# Patient Record
Sex: Female | Born: 1985 | Race: Black or African American | Hispanic: No | Marital: Single | Smoking: Current every day smoker
Health system: Southern US, Community
[De-identification: ages and names within clinical notes are randomized; demographics above are authoritative.]

## PROBLEM LIST (undated history)

## (undated) DIAGNOSIS — F319 Bipolar disorder, unspecified: Secondary | ICD-10-CM

## (undated) DIAGNOSIS — R519 Headache, unspecified: Secondary | ICD-10-CM

## (undated) HISTORY — PX: BREAST CYST EXCISION: SHX579

## (undated) HISTORY — PX: TUBAL LIGATION: SHX77

---

## 2008-07-04 ENCOUNTER — Emergency Department (HOSPITAL_COMMUNITY): Admission: EM | Admit: 2008-07-04 | Discharge: 2008-07-04 | Payer: Self-pay | Admitting: Emergency Medicine

## 2008-07-18 ENCOUNTER — Emergency Department (HOSPITAL_COMMUNITY): Admission: EM | Admit: 2008-07-18 | Discharge: 2008-07-18 | Payer: Self-pay | Admitting: Emergency Medicine

## 2008-08-27 ENCOUNTER — Emergency Department (HOSPITAL_COMMUNITY): Admission: EM | Admit: 2008-08-27 | Discharge: 2008-08-27 | Payer: Self-pay | Admitting: Emergency Medicine

## 2008-09-18 ENCOUNTER — Emergency Department (HOSPITAL_COMMUNITY): Admission: EM | Admit: 2008-09-18 | Discharge: 2008-09-18 | Payer: Self-pay | Admitting: Emergency Medicine

## 2010-01-11 ENCOUNTER — Ambulatory Visit: Payer: Self-pay | Admitting: Diagnostic Radiology

## 2010-01-11 ENCOUNTER — Emergency Department (HOSPITAL_BASED_OUTPATIENT_CLINIC_OR_DEPARTMENT_OTHER): Admission: EM | Admit: 2010-01-11 | Discharge: 2010-01-11 | Payer: Self-pay | Admitting: Certified Registered"

## 2010-09-19 LAB — URINALYSIS, ROUTINE W REFLEX MICROSCOPIC
Bilirubin Urine: NEGATIVE
Nitrite: NEGATIVE
Protein, ur: NEGATIVE mg/dL
Specific Gravity, Urine: 1.027 (ref 1.005–1.030)
Urobilinogen, UA: 1 mg/dL (ref 0.0–1.0)

## 2010-09-19 LAB — COMPREHENSIVE METABOLIC PANEL
Albumin: 3.6 g/dL (ref 3.5–5.2)
BUN: 8 mg/dL (ref 6–23)
CO2: 23 mEq/L (ref 19–32)
GFR calc Af Amer: >60 mL/min (ref >60)
Glucose, Bld: 81 mg/dL (ref 70–99)
Potassium: 4.1 mEq/L (ref 3.5–5.1)

## 2010-09-19 LAB — CBC
HCT: 33.9 % — ABNORMAL LOW (ref 36.0–46.0)
Hemoglobin: 11.3 g/dL — ABNORMAL LOW (ref 12.0–15.0)
MCH: 28.4 pg (ref 26.0–34.0)
MCHC: 33.3 g/dL (ref 30.0–36.0)
MCV: 85 fL (ref 78.0–100.0)
Platelets: 276 10*3/uL (ref 150–400)
RBC: 3.98 MIL/uL (ref 3.87–5.11)
RDW: 12.9 % (ref 11.5–15.5)

## 2010-09-19 LAB — WET PREP, GENITAL: Yeast Wet Prep HPF POC: NONE SEEN

## 2010-09-19 LAB — URINE MICROSCOPIC-ADD ON

## 2010-09-19 LAB — GC/CHLAMYDIA PROBE AMP, GENITAL
Chlamydia, DNA Probe: NEGATIVE
GC Probe Amp, Genital: NEGATIVE

## 2010-10-14 LAB — RAPID STREP SCREEN (MED CTR MEBANE ONLY): Streptococcus, Group A Screen (Direct): POSITIVE — AB

## 2010-10-18 LAB — URINALYSIS, ROUTINE W REFLEX MICROSCOPIC
Nitrite: NEGATIVE
Specific Gravity, Urine: 1.035 — ABNORMAL HIGH (ref 1.005–1.030)
Urobilinogen, UA: 1 mg/dL (ref 0.0–1.0)
pH: 7 (ref 5.0–8.0)

## 2010-10-18 LAB — POCT I-STAT, CHEM 8
BUN: 12 mg/dL (ref 6–23)
Calcium, Ion: 1.15 mmol/L (ref 1.12–1.32)
Chloride: 106 mEq/L (ref 96–112)
Creatinine, Ser: 0.9 mg/dL (ref 0.4–1.2)
Glucose, Bld: 92 mg/dL (ref 70–99)
HCT: 44 % (ref 36.0–46.0)
Hemoglobin: 15 g/dL (ref 12.0–15.0)
Potassium: 4.8 mEq/L (ref 3.5–5.1)
Sodium: 141 mEq/L (ref 135–145)
TCO2: 25 mmol/L (ref 0–100)

## 2010-10-18 LAB — URINE MICROSCOPIC-ADD ON

## 2011-04-27 ENCOUNTER — Emergency Department (INDEPENDENT_AMBULATORY_CARE_PROVIDER_SITE_OTHER): Payer: Self-pay

## 2011-04-27 ENCOUNTER — Emergency Department (HOSPITAL_BASED_OUTPATIENT_CLINIC_OR_DEPARTMENT_OTHER)
Admission: EM | Admit: 2011-04-27 | Discharge: 2011-04-27 | Disposition: A | Discharge status: Home / Self Care | Payer: Self-pay | Attending: Emergency Medicine | Admitting: Emergency Medicine

## 2011-04-27 DIAGNOSIS — X58XXXA Exposure to other specified factors, initial encounter: Secondary | ICD-10-CM

## 2011-04-27 DIAGNOSIS — F17200 Nicotine dependence, unspecified, uncomplicated: Secondary | ICD-10-CM | POA: Insufficient documentation

## 2011-04-27 DIAGNOSIS — M25569 Pain in unspecified knee: Secondary | ICD-10-CM

## 2011-04-27 NOTE — ED Notes (Signed)
Right knee pain x 2 weeks

## 2011-04-27 NOTE — ED Provider Notes (Signed)
History     CSN: [id number] Arrival date & time: 04/27/2011  5:55 PM   First MD Initiated Contact with Patient 04/27/11 1756      Chief Complaint  Patient presents with  . Knee Pain    (Consider location/radiation/quality/duration/timing/severity/associated sxs/prior treatment) HPI Comments: Pt states that her knee makes a weird noise when she bend and sometime her knee gives out on her and she falls  Patient is a 25 y.o. female presenting with knee pain. The history is provided by the patient. No language interpreter was used.  Knee Pain This is a new problem. The current episode started 1 to 4 weeks ago. The problem occurs constantly. The problem has been unchanged. Pertinent negatives include no fever or numbness. The symptoms are aggravated by bending. She has tried nothing for the symptoms.    History reviewed. No pertinent past medical history.  Past Surgical History  Procedure Date  . Tubal ligation     No family history on file.  History  Substance Use Topics  . Smoking status: Current Everyday Smoker  . Smokeless tobacco: Not on file  . Alcohol Use: No    OB History    Grav Para Term Preterm Abortions TAB SAB Ect Mult Living                  Review of Systems  Constitutional: Negative for fever.  Respiratory: Negative.   Cardiovascular: Negative.   Musculoskeletal:       Right knee pain  Neurological: Negative for numbness.    Allergies  Review of patient's allergies indicates not on file.  Home Medications  No current outpatient prescriptions on file.  BP 134/72  Pulse 89  Temp(Src) 98.1 F (36.7 C) (Oral)  Resp 16  Ht 5\' 4"  (1.626 m)  Wt 180 lb (81.647 kg)  BMI 30.90 kg/m2  SpO2 100%  LMP 04/02/2011  Physical Exam  Nursing note and vitals reviewed. Constitutional: She is oriented to person, place, and time. She appears well-developed.  HENT:  Head: Normocephalic and atraumatic.  Cardiovascular: Normal rate.   Pulmonary/Chest:  Effort normal and breath sounds normal.  Musculoskeletal: Normal range of motion.       No obvious deformity or swelling noted to the area  Neurological: She is alert and oriented to person, place, and time.    ED Course  Procedures (including critical care time)  Labs Reviewed - No data to display Dg Knee Complete 4 Views Right  04/27/2011  *RADIOLOGY REPORT*  Clinical Data: Remote right knee injury with intermittent episodes of the knee buckling.  Anterior knee pain currently.  No recent injuries.  RIGHT KNEE - COMPLETE 4+ VIEW 04/27/2011:  Comparison: None.  Findings: No evidence of acute, subacute, or healed fractures. Well-preserved joint spaces.  No intrinsic osseous abnormalities. No evidence of a significant joint effusion.  IMPRESSION: Normal examination.  Original Report Authenticated By: Arnell Sieving, M.D.     1. Knee pain       MDM  No acute finding on exam or on x-ray:pt is okay to follow up as needed        Teressa Lower, NP 04/27/11 1846

## 2011-04-28 NOTE — ED Provider Notes (Signed)
Medical screening examination/treatment/procedure(s) were performed by non-physician practitioner and as supervising physician I was immediately available for consultation/collaboration.   Bevan Disney A. Krystl Wickware, MD 04/28/11 0003 

## 2013-09-09 ENCOUNTER — Emergency Department (HOSPITAL_BASED_OUTPATIENT_CLINIC_OR_DEPARTMENT_OTHER)
Admission: EM | Admit: 2013-09-09 | Discharge: 2013-09-09 | Disposition: A | Discharge status: Home / Self Care | Payer: No Typology Code available for payment source | Attending: Emergency Medicine | Admitting: Emergency Medicine

## 2013-09-09 ENCOUNTER — Encounter (HOSPITAL_BASED_OUTPATIENT_CLINIC_OR_DEPARTMENT_OTHER): Payer: Self-pay | Admitting: Emergency Medicine

## 2013-09-09 ENCOUNTER — Emergency Department (HOSPITAL_BASED_OUTPATIENT_CLINIC_OR_DEPARTMENT_OTHER): Payer: No Typology Code available for payment source

## 2013-09-09 DIAGNOSIS — K5289 Other specified noninfective gastroenteritis and colitis: Secondary | ICD-10-CM | POA: Insufficient documentation

## 2013-09-09 DIAGNOSIS — Z79899 Other long term (current) drug therapy: Secondary | ICD-10-CM | POA: Insufficient documentation

## 2013-09-09 DIAGNOSIS — Z880 Allergy status to penicillin: Secondary | ICD-10-CM | POA: Insufficient documentation

## 2013-09-09 DIAGNOSIS — F17200 Nicotine dependence, unspecified, uncomplicated: Secondary | ICD-10-CM | POA: Insufficient documentation

## 2013-09-09 DIAGNOSIS — Z3202 Encounter for pregnancy test, result negative: Secondary | ICD-10-CM | POA: Insufficient documentation

## 2013-09-09 DIAGNOSIS — K625 Hemorrhage of anus and rectum: Secondary | ICD-10-CM | POA: Insufficient documentation

## 2013-09-09 DIAGNOSIS — K529 Noninfective gastroenteritis and colitis, unspecified: Secondary | ICD-10-CM

## 2013-09-09 DIAGNOSIS — F319 Bipolar disorder, unspecified: Secondary | ICD-10-CM | POA: Insufficient documentation

## 2013-09-09 DIAGNOSIS — R634 Abnormal weight loss: Secondary | ICD-10-CM | POA: Insufficient documentation

## 2013-09-09 HISTORY — DX: Bipolar disorder, unspecified: F31.9

## 2013-09-09 LAB — CBC WITH DIFFERENTIAL/PLATELET
Basophils Absolute: 0 10*3/uL (ref 0.0–0.1)
Basophils Relative: 1 % (ref 0–1)
EOS PCT: 5 % (ref 0–5)
Eosinophils Absolute: 0.3 10*3/uL (ref 0.0–0.7)
HCT: 31.7 % — ABNORMAL LOW (ref 36.0–46.0)
HEMOGLOBIN: 10.4 g/dL — AB (ref 12.0–15.0)
LYMPHS PCT: 52 % — AB (ref 12–46)
Lymphs Abs: 3.3 10*3/uL (ref 0.7–4.0)
MCH: 27.7 pg (ref 26.0–34.0)
MCHC: 32.8 g/dL (ref 30.0–36.0)
MCV: 84.5 fL (ref 78.0–100.0)
MONO ABS: 0.7 10*3/uL (ref 0.1–1.0)
MONOS PCT: 10 % (ref 3–12)
NEUTROS ABS: 2 10*3/uL (ref 1.7–7.7)
Neutrophils Relative %: 32 % — ABNORMAL LOW (ref 43–77)
PLATELETS: 275 10*3/uL (ref 150–400)
RBC: 3.75 MIL/uL — AB (ref 3.87–5.11)
RDW: 13.8 % (ref 11.5–15.5)
WBC: 6.4 10*3/uL (ref 4.0–10.5)

## 2013-09-09 LAB — COMPREHENSIVE METABOLIC PANEL
ALT: 8 U/L (ref 0–35)
AST: 14 U/L (ref 0–37)
Albumin: 3.2 g/dL — ABNORMAL LOW (ref 3.5–5.2)
Alkaline Phosphatase: 51 U/L (ref 39–117)
BUN: 11 mg/dL (ref 6–23)
CALCIUM: 8.2 mg/dL — AB (ref 8.4–10.5)
CO2: 21 mEq/L (ref 19–32)
Chloride: 108 mEq/L (ref 96–112)
Creatinine, Ser: 0.7 mg/dL (ref 0.50–1.10)
GFR calc non Af Amer: >90 mL/min (ref >90)
Glucose, Bld: 97 mg/dL (ref 70–99)
Potassium: 3.9 mEq/L (ref 3.7–5.3)
SODIUM: 141 meq/L (ref 137–147)
Total Protein: 6.2 g/dL (ref 6.0–8.3)

## 2013-09-09 LAB — URINALYSIS, ROUTINE W REFLEX MICROSCOPIC
BILIRUBIN URINE: NEGATIVE
GLUCOSE, UA: NEGATIVE mg/dL
HGB URINE DIPSTICK: NEGATIVE
Ketones, ur: NEGATIVE mg/dL
Leukocytes, UA: NEGATIVE
NITRITE: NEGATIVE
PH: 6 (ref 5.0–8.0)
PROTEIN: NEGATIVE mg/dL
Specific Gravity, Urine: 1.029 (ref 1.005–1.030)
UROBILINOGEN UA: 1 mg/dL (ref 0.0–1.0)

## 2013-09-09 LAB — OCCULT BLOOD X 1 CARD TO LAB, STOOL: Fecal Occult Bld: POSITIVE — AB

## 2013-09-09 LAB — PREGNANCY, URINE: PREG TEST UR: NEGATIVE

## 2013-09-09 MED ORDER — IOHEXOL 300 MG/ML  SOLN
100.0000 mL | Freq: Once | INTRAMUSCULAR | Status: AC | PRN
  Administered 2013-09-09: 100 mL via INTRAVENOUS

## 2013-09-09 MED ORDER — CIPROFLOXACIN HCL 500 MG PO TABS: 500.0000 mg | ORAL_TABLET | Freq: Two times a day (BID) | ORAL | Status: DC

## 2013-09-09 MED ORDER — IOHEXOL 300 MG/ML  SOLN
50.0000 mL | Freq: Once | INTRAMUSCULAR | Status: AC | PRN
  Administered 2013-09-09: 50 mL via ORAL

## 2013-09-09 MED ORDER — METRONIDAZOLE 500 MG PO TABS: 500.0000 mg | ORAL_TABLET | Freq: Two times a day (BID) | ORAL | Status: DC

## 2013-09-09 MED ORDER — SODIUM CHLORIDE 0.9 % IV BOLUS (SEPSIS)
1000.0000 mL | Freq: Once | INTRAVENOUS | Status: AC
  Administered 2013-09-09: 1000 mL via INTRAVENOUS

## 2013-09-09 NOTE — Discharge Instructions (Signed)
Abdominal Pain, Adult °Many things can cause abdominal pain. Usually, abdominal pain is not caused by a disease and will improve without treatment. It can often be observed and treated at home. Your health care provider will do a physical exam and possibly order blood tests and X-rays to help determine the seriousness of your pain. However, in many cases, more time must pass before a clear cause of the pain can be found. Before that point, your health care provider may not know if you need more testing or further treatment. °HOME CARE INSTRUCTIONS  °Monitor your abdominal pain for any changes. The following actions may help to alleviate any discomfort you are experiencing: °· Only take over-the-counter or prescription medicines as directed by your health care provider. °· Do not take laxatives unless directed to do so by your health care provider. °· Try a clear liquid diet (broth, tea, or water) as directed by your health care provider. Slowly move to a bland diet as tolerated. °SEEK MEDICAL CARE IF: °· You have unexplained abdominal pain. °· You have abdominal pain associated with nausea or diarrhea. °· You have pain when you urinate or have a bowel movement. °· You experience abdominal pain that wakes you in the night. °· You have abdominal pain that is worsened or improved by eating food. °· You have abdominal pain that is worsened with eating fatty foods. °SEEK IMMEDIATE MEDICAL CARE IF:  °· Your pain does not go away within 2 hours. °· You have a fever. °· You keep throwing up (vomiting). °· Your pain is felt only in portions of the abdomen, such as the right side or the left lower portion of the abdomen. °· You pass bloody or black tarry stools. °MAKE SURE YOU: °· Understand these instructions.   °· Will watch your condition.   °· Will get help right away if you are not doing well or get worse.   °Document Released: 03/30/2005 Document Revised: 04/10/2013 Document Reviewed: 02/27/2013 °[employer]® Patient  Information ©2014 [employer]. ° °Bloody Stools °Bloody stools often mean that there is a problem in the digestive tract. Your caregiver may use the term "melena" to describe black, tarry, and bad smelling stools or "hematochezia" to describe red or maroon-colored stools. Blood seen in the stool can be caused by bleeding anywhere along the intestinal tract.  °A black stool usually means that blood is coming from the upper part of the gastrointestinal tract (esophagus, stomach, or small bowel). Passing maroon-colored stools or bright red blood usually means that blood is coming from lower down in the large bowel or the rectum. However, sometimes massive bleeding in the stomach or small intestine can cause bright red bloody stools.  °Consuming black licorice, lead, iron pills, medicines containing bismuth subsalicylate, or blueberries can also cause black stools. Your caregiver can test black stools to see if blood is present. °It is important that the cause of the bleeding be found. Treatment can then be started, and the problem can be corrected. Rectal bleeding may not be serious, but you should not assume everything is okay until you know the cause. It is very important to follow up with your caregiver or a specialist in gastrointestinal problems. °CAUSES  °Blood in the stools can come from various underlying causes. Often, the cause is not found during your first visit. Testing is often needed to discover the cause of bleeding in the gastrointestinal tract. Causes range from simple to serious or even life-threatening. Possible causes include: °· Hemorrhoids. These are veins that are full of blood (engorged)   in the rectum. They cause pain, inflammation, and may bleed. °· Anal fissures. These are areas of painful tearing which may bleed. They are often caused by passing hard stool. °· Diverticulosis. These are pouches that form on the colon over time, with age, and may bleed significantly. °· Diverticulitis. This  is inflammation in areas with diverticulosis. It can cause pain, fever, and bloody stools, although bleeding is rare. °· Proctitis and colitis. These are inflamed areas of the rectum or colon. They may cause pain, fever, and bloody stools. °· Polyps and cancer. Colon cancer is a leading cause of preventable cancer death. It often starts out as precancerous polyps that can be removed during a colonoscopy, preventing progression into cancer. Sometimes, polyps and cancer may cause rectal bleeding. °· Gastritis and ulcers. Bleeding from the upper gastrointestinal tract (near the stomach) may travel through the intestines and produce black, sometimes tarry, often bad smelling stools. In certain cases, if the bleeding is fast enough, the stools may not be black, but red and the condition may be life-threatening. °SYMPTOMS  °You may have stools that are bright red and bloody, that are normal color with blood on them, or that are dark black and tarry. In some cases, you may only have blood in the toilet bowl. Any of these cases need medical care. You may also have: °· Pain at the anus or anywhere in the rectum. °· Lightheadedness or feeling faint. °· Extreme weakness. °· Nausea or vomiting. °· Fever. °DIAGNOSIS °Your caregiver may use the following methods to find the cause of your bleeding: °· Taking a medical history. Age is important. Older people tend to develop polyps and cancer more often. If there is anal pain and a hard, large stool associated with bleeding, a tear of the anus may be the cause. If blood drips into the toilet after a bowel movement, bleeding hemorrhoids may be the problem. The color and frequency of the bleeding are additional considerations. In most cases, the medical history provides clues, but seldom the final answer. °· A visual and finger (digital) exam. Your caregiver will inspect the anal area, looking for tears and hemorrhoids. A finger exam can provide information when there is tenderness or  a growth inside. In men, the prostate is also examined. °· Endoscopy. Several types of small, long scopes (endoscopes) are used to view the colon. °· In the office, your caregiver may use a rigid, or more commonly, a flexible viewing sigmoidoscope. This exam is called flexible sigmoidoscopy. It is performed in 5 to 10 minutes. °· A more thorough exam is accomplished with a colonoscope. It allows your caregiver to view the entire 5 to 6 foot long colon. Medicine to help you relax (sedative) is usually given for this exam. Frequently, a bleeding lesion may be present beyond the reach of the sigmoidoscope. So, a colonoscopy may be the best exam to start with. Both exams are usually done on an outpatient basis. This means the patient does not stay overnight in the hospital or surgery center. °· An upper endoscopy may be needed to examine your stomach. Sedation is used and a flexible endoscope is put in your mouth, down to your stomach. °· A barium enema X-ray. This is an X-ray exam. It uses liquid barium inserted by enema into the rectum. This test alone may not identify an actual bleeding point. X-rays highlight abnormal shadows, such as those made by lumps (tumors), diverticuli, or colitis. °TREATMENT  °Treatment depends on the cause of your bleeding.  °· For bleeding from   the stomach or colon, the caregiver doing your endoscopy or colonoscopy may be able to stop the bleeding as part of the procedure. °· Inflammation or infection of the colon can be treated with medicines. °· Many rectal problems can be treated with creams, suppositories, or warm baths. °· Surgery is sometimes needed. °· Blood transfusions are sometimes needed if you have lost a lot of blood. °· For any bleeding problem, let your caregiver know if you take aspirin or other blood thinners regularly. °HOME CARE INSTRUCTIONS  °· Take any medicines exactly as prescribed. °· Keep your stools soft by eating a diet high in fiber. Prunes (1 to 3 a day) work  well for many people. °· Drink enough water and fluids to keep your urine clear or pale yellow. °· Take sitz baths if advised. A sitz bath is when you sit in a bathtub with warm water for 10 to 15 minutes to soak, soothe, and cleanse the rectal area. °· If enemas or suppositories are advised, be sure you know how to use them. Tell your caregiver if you have problems with this. °· Monitor your bowel movements to look for signs of improvement or worsening. °SEEK MEDICAL CARE IF:  °· You do not improve in the time expected. °· Your condition worsens after initial improvement. °· You develop any new symptoms. °SEEK IMMEDIATE MEDICAL CARE IF:  °· You develop severe or prolonged rectal bleeding. °· You vomit blood. °· You feel weak or faint. °· You have a fever. °MAKE SURE YOU: °· Understand these instructions. °· Will watch your condition. °· Will get help right away if you are not doing well or get worse. °Document Released: 06/10/2002 Document Revised: 09/12/2011 Document Reviewed: 11/05/2010 °[employer]® Patient Information ©2014 [employer]. ° °

## 2013-09-09 NOTE — ED Notes (Signed)
Rectal bleeding onset last night

## 2013-09-09 NOTE — ED Provider Notes (Signed)
CSN: [id number]     Arrival date & time 09/09/13  1040 History   First MD Initiated Contact with Patient 09/09/13 1136     Chief Complaint  Patient presents with  . Abdominal Pain  . Rectal Bleeding     (Consider location/radiation/quality/duration/timing/severity/associated sxs/prior Treatment) HPI Comments: 28 year old female presents with bleeding per rectum since yesterday. She noticed dark red blood when she went to stool and has had a couple episodes since. She states she's been having some lower abdominal pain that she describes as mild. She feels like the pain is in her left lower abdomen and seems to radiate to her rectum. Denies any fevers or chills. Has a history of hemorrhoids but states this does not feel similar. She states that she's not any nausea or vomiting. She feels that she's lost 7 pounds over the last 2 weeks without trying. No prior history of Crohn's or ulcerative colitis. No family history of inflammatory bowel disease.   Past Medical History  Diagnosis Date  . Bipolar 1 disorder    Past Surgical History  Procedure Laterality Date  . Tubal ligation     History reviewed. No pertinent family history. History  Substance Use Topics  . Smoking status: Current Every Day Smoker  . Smokeless tobacco: Not on file  . Alcohol Use: Yes     Comment: occ   OB History   Grav Para Term Preterm Abortions TAB SAB Ect Mult Living                 Review of Systems  Constitutional: Positive for unexpected weight change. Negative for fever and chills.  Gastrointestinal: Positive for abdominal pain and blood in stool. Negative for nausea, vomiting, diarrhea and constipation.  Genitourinary: Negative for dysuria.  Musculoskeletal: Negative for back pain.  All other systems reviewed and are negative.      Allergies  Keflex and Penicillins cross reactors  Home Medications   Current Outpatient Rx  Name  Route  Sig  Dispense  Refill  . ARIPiprazole (ABILIFY) 5 MG  tablet   Oral   Take 5 mg by mouth daily.           Marland Kitchen. buPROPion (WELLBUTRIN XL) 150 MG 24 hr tablet   Oral   Take 150 mg by mouth daily.            BP 112/69  Pulse 84  Temp(Src) 98.6 F (37 C) (Oral)  Resp 16  Ht 5\' 4"  (1.626 m)  Wt 180 lb (81.647 kg)  BMI 30.88 kg/m2  SpO2 100%  LMP 09/05/2013 Physical Exam  Nursing note and vitals reviewed. Constitutional: She is oriented to person, place, and time. She appears well-developed and well-nourished. No distress.  HENT:  Head: Normocephalic and atraumatic.  Right Ear: External ear normal.  Left Ear: External ear normal.  Nose: Nose normal.  Eyes: Right eye exhibits no discharge. Left eye exhibits no discharge.  Cardiovascular: Normal rate, regular rhythm and normal heart sounds.   Pulmonary/Chest: Effort normal and breath sounds normal.  Abdominal: Soft. There is tenderness in the right lower quadrant, suprapubic area and left lower quadrant.  Genitourinary: Rectal exam shows tenderness (mild). Rectal exam shows no external hemorrhoid, no internal hemorrhoid, no mass and anal tone normal. Guaiac positive stool.  Neurological: She is alert and oriented to person, place, and time.  Skin: Skin is warm and dry.    ED Course  Procedures (including critical care time) Labs Review Labs Reviewed  CBC WITH  DIFFERENTIAL - Abnormal; Notable for the following:    RBC 3.75 (*)    Hemoglobin 10.4 (*)    HCT 31.7 (*)    Neutrophils Relative % 32 (*)    Lymphocytes Relative 52 (*)    All other components within normal limits  COMPREHENSIVE METABOLIC PANEL - Abnormal; Notable for the following:    Calcium 8.2 (*)    Albumin 3.2 (*)    Total Bilirubin <0.2 (*)    All other components within normal limits  OCCULT BLOOD X 1 CARD TO LAB, STOOL - Abnormal; Notable for the following:    Fecal Occult Bld POSITIVE (*)    All other components within normal limits  URINALYSIS, ROUTINE W REFLEX MICROSCOPIC  PREGNANCY, URINE   Imaging  Review Ct Abdomen Pelvis W Contrast  09/09/2013   CLINICAL DATA:  Lower abdominal pain and cramping. Recent blood in stools.  EXAM: CT ABDOMEN AND PELVIS WITH CONTRAST  TECHNIQUE: Multidetector CT imaging of the abdomen and pelvis was performed using the standard protocol following bolus administration of intravenous contrast.  CONTRAST:  50mL OMNIPAQUE IOHEXOL 300 MG/ML SOLN, OMNIPAQUE IOHEXOL 300 MG/ML SOLN  COMPARISON:  CT of the abdomen and pelvis 10/21/2009.  FINDINGS: Lung Bases: Unremarkable.  Abdomen/Pelvis: The appearance of the liver, gallbladder, pancreas, spleen, bilateral adrenal glands and bilateral kidneys is unremarkable. No significant volume of ascites. No pneumoperitoneum. No pathologic distention of small bowel. Normal appendix. There are some areas of potential colonic wall thickening, particularly in the ascending colon and hepatic flexure and, however, these areas are relatively under distended, which can result in artifactual colonic wall thickening. No surrounding inflammatory changes are noted at this time. No definite lymphadenopathy identified within the abdomen or pelvis. Uterus and ovaries are unremarkable in appearance. Bilateral tubal ligation clips are noted. Urinary bladder is unremarkable in appearance.  Musculoskeletal: There are no aggressive appearing lytic or blastic lesions noted in the visualized portions of the skeleton.  IMPRESSION: 1. There are potential areas of colonic wall thickening involving the ascending colon and hepatic flexure. However, there are no surrounding inflammatory changes at this time, and these areas of the colon are relatively decompressed which can result in artifactual wall thickening. Clinical correlation is recommended for signs and symptoms of potential colitis. 2. No other potential acute findings are identified in the abdomen or pelvis at this time. 3. Specifically, the appendix is normal. 4. Additional incidental findings, as above.    Electronically Signed   By: Trudie Reed M.D.   On: 09/09/2013 13:36     EKG Interpretation None      MDM   Final diagnoses:  Bright red blood per rectum  Colitis    The patient is well-appearing and has only minimal abdominal tenderness. Given the new abdominal pain and bright red blood per abdomen CT was obtained that shows possible colitis although there's no obvious inflammation. The patient has remained stable and well-appearing here. I feel he has mild pain and appears comfortable I highly doubt ischemic colitis. As the case with Dr. Dulce Sellar, gastroenterology on call, who recommends antibiotics (cipro/flagyl) with probiotics as well as followup in his clinic in one to 2 weeks. He feels that given her age group and the CT scan results is most likely infectious. She may or may not need it colonoscopy depending on how her course goes. At this point her anemia is mild and feels she is stable for outpatient treatment. She's given strict return precautions.    Giani Betzold  Scarlette Calico, MD 09/09/13 1501

## 2013-10-13 ENCOUNTER — Emergency Department (HOSPITAL_BASED_OUTPATIENT_CLINIC_OR_DEPARTMENT_OTHER): Payer: No Typology Code available for payment source

## 2013-10-13 ENCOUNTER — Encounter (HOSPITAL_BASED_OUTPATIENT_CLINIC_OR_DEPARTMENT_OTHER): Payer: Self-pay | Admitting: Emergency Medicine

## 2013-10-13 ENCOUNTER — Emergency Department (HOSPITAL_BASED_OUTPATIENT_CLINIC_OR_DEPARTMENT_OTHER)
Admission: EM | Admit: 2013-10-13 | Discharge: 2013-10-13 | Disposition: A | Discharge status: Home / Self Care | Payer: No Typology Code available for payment source | Attending: Emergency Medicine | Admitting: Emergency Medicine

## 2013-10-13 DIAGNOSIS — Z880 Allergy status to penicillin: Secondary | ICD-10-CM | POA: Insufficient documentation

## 2013-10-13 DIAGNOSIS — Y92838 Other recreation area as the place of occurrence of the external cause: Secondary | ICD-10-CM

## 2013-10-13 DIAGNOSIS — F17200 Nicotine dependence, unspecified, uncomplicated: Secondary | ICD-10-CM | POA: Insufficient documentation

## 2013-10-13 DIAGNOSIS — S5290XA Unspecified fracture of unspecified forearm, initial encounter for closed fracture: Secondary | ICD-10-CM | POA: Insufficient documentation

## 2013-10-13 DIAGNOSIS — R296 Repeated falls: Secondary | ICD-10-CM | POA: Insufficient documentation

## 2013-10-13 DIAGNOSIS — Y9341 Activity, dancing: Secondary | ICD-10-CM | POA: Insufficient documentation

## 2013-10-13 DIAGNOSIS — Y9239 Other specified sports and athletic area as the place of occurrence of the external cause: Secondary | ICD-10-CM | POA: Insufficient documentation

## 2013-10-13 DIAGNOSIS — Z8659 Personal history of other mental and behavioral disorders: Secondary | ICD-10-CM | POA: Insufficient documentation

## 2013-10-13 DIAGNOSIS — S52122A Displaced fracture of head of left radius, initial encounter for closed fracture: Secondary | ICD-10-CM

## 2013-10-13 MED ORDER — OXYCODONE-ACETAMINOPHEN 5-325 MG PO TABS: 2.0000 | ORAL_TABLET | Freq: Four times a day (QID) | ORAL | Status: DC | PRN

## 2013-10-13 MED ORDER — HYDROMORPHONE HCL PF 1 MG/ML IJ SOLN
1.0000 mg | Freq: Once | INTRAMUSCULAR | Status: AC
  Administered 2013-10-13: 1 mg via INTRAMUSCULAR
  Filled 2013-10-13: qty 1

## 2013-10-13 MED ORDER — ONDANSETRON 4 MG PO TBDP
4.0000 mg | ORAL_TABLET | Freq: Once | ORAL | Status: AC
  Administered 2013-10-13: 4 mg via ORAL

## 2013-10-13 MED ORDER — ONDANSETRON 4 MG PO TBDP
ORAL_TABLET | ORAL | Status: AC
  Administered 2013-10-13: 4 mg via ORAL
  Filled 2013-10-13: qty 1

## 2013-10-13 MED ORDER — LORAZEPAM 1 MG PO TABS
1.0000 mg | ORAL_TABLET | Freq: Once | ORAL | Status: AC
  Administered 2013-10-13: 1 mg via ORAL
  Filled 2013-10-13: qty 1

## 2013-10-13 NOTE — ED Notes (Signed)
Pt fell on left arm on Saturday morning.  Pt having severe pain to left elbow, pain radiates up arm and down to fingers.  Able to move fingers and left shoulder but the pain is severe at elbow with movement.

## 2013-10-13 NOTE — ED Provider Notes (Signed)
CSN: [id number]     Arrival date & time 10/13/13  4098 History   First MD Initiated Contact with Patient 10/13/13 0945     Chief Complaint  Patient presents with  . Arm Injury     (Consider location/radiation/quality/duration/timing/severity/associated sxs/prior Treatment) Patient is a 28 y.o. female presenting with arm injury. The history is provided by the patient.  Arm Injury Location:  Elbow Time since incident:  36 hours Injury: yes   Mechanism of injury: fall   Fall:    Fall occurred: on the dance floor.   Impact surface:  Hard floor   Point of impact:  Outstretched arms   Entrapped after fall: no   Elbow location:  L elbow Pain details:    Quality:  Dull   Radiates to:  Does not radiate   Severity:  Severe   Onset quality:  Gradual   Duration:  36 hours   Timing:  Constant   Progression:  Worsening Relieved by:  Nothing Worsened by:  Nothing tried Ineffective treatments:  None tried Associated symptoms: no back pain, no fever and no neck pain     Past Medical History  Diagnosis Date  . Bipolar 1 disorder    Past Surgical History  Procedure Laterality Date  . Tubal ligation     No family history on file. History  Substance Use Topics  . Smoking status: Current Every Day Smoker  . Smokeless tobacco: Not on file  . Alcohol Use: Yes     Comment: occ   OB History   Grav Para Term Preterm Abortions TAB SAB Ect Mult Living                 Review of Systems  Constitutional: Negative for fever.  Musculoskeletal: Negative for back pain and neck pain.  All other systems reviewed and are negative.     Allergies  Keflex and Penicillins cross reactors  Home Medications  No current outpatient prescriptions on file. BP 140/75  Pulse 92  Temp(Src) 99 F (37.2 C) (Oral)  Resp 16  SpO2 98%  LMP 10/07/2013 Physical Exam  Nursing note and vitals reviewed. Constitutional: She is oriented to person, place, and time. She appears well-developed and  well-nourished. No distress.  HENT:  Head: Normocephalic and atraumatic.  Eyes: EOM are normal. Pupils are equal, round, and reactive to light.  Neck: Normal range of motion. Neck supple.  Cardiovascular: Normal rate and regular rhythm.  Exam reveals no friction rub.   No murmur heard. Pulmonary/Chest: Effort normal and breath sounds normal. No respiratory distress. She has no wheezes. She has no rales.  Abdominal: Soft. She exhibits no distension. There is no tenderness. There is no rebound.  Musculoskeletal: She exhibits no edema.       Left shoulder: She exhibits decreased range of motion and spasm (anteriorly). She exhibits no bony tenderness, no swelling, no effusion, no crepitus and no deformity.       Left elbow: She exhibits decreased range of motion and swelling (mild). She exhibits no effusion, no deformity and no laceration. Tenderness (diffuse) found. Radial head, medial epicondyle, lateral epicondyle and olecranon process tenderness noted.       Left wrist: Normal.       Left upper arm: She exhibits bony tenderness (distal humerus) and swelling (mild). She exhibits no deformity and no laceration.       Left hand: Normal.  Neurological: She is alert and oriented to person, place, and time. No cranial nerve deficit. She  exhibits normal muscle tone.  Skin: No rash noted. She is not diaphoretic.    ED Course  Procedures (including critical care time) Labs Review Labs Reviewed - No data to display Imaging Review Dg Elbow Complete Left  10/13/2013   CLINICAL DATA:  ARM INJURY  EXAM: LEFT ELBOW - COMPLETE 3+ VIEW ; the patient had difficulty with assuming the oblique views  COMPARISON:  None.  FINDINGS: The bones appear adequately mineralized. The radial head exhibits a subtle fracture involving the articular surface. No fracture of the olecranon is demonstrated. The condylar and supracondylar regions are grossly normal though a straight AP view is not demonstrated. There is a small  posterior fat pad sign consistent with a joint effusion. .  IMPRESSION: There is a subtle fracture involving the articular surface of the radial head. The adjacent olecranon and distal humerus appear intact. There is a small joint effusion.   Electronically Signed   By: David  Swazil[place]   On: 10/13/2013 10:17     EKG Interpretation None      MDM   Final diagnoses:  Left radial head fracture    19F presents with L FOOSH. Happened while dancing 2 nights ago - so roughly 36 hours since injury. Unable to move L arm without pain. Difficulty with elbow extension, pronation, supination due to pain. On exam, good pulses distally, good movement of hand, wrist, fingers. Decreased ROM of L elbow, L shoulder. Extreme tenderness on elbow. Muscle spasm in L shoulder.  Initial L elbow xray with radial head fx with small effusion. Will xray rest of L arm since in extreme pain. Xray with L radial fracture fx, no other fxs identified. Posterior splint placed, given Ortho f/u.  I have reviewed all labs and imaging and considered them in my medical decision making.     Dagmar HaitWilliam Zuly Belkin, MD 10/13/13 [phone]

## 2013-10-13 NOTE — ED Notes (Signed)
Patient transported to X-ray 

## 2013-10-13 NOTE — Discharge Instructions (Signed)
Elbow Fracture, Radial Head °with Rehab °The radial head is the end of the forearm bone on the thumb side of the arm (radius). It is part of the elbow. The radial head is vulnerable to both complete and incomplete fractures. °SYMPTOMS  °· Severe elbow and arm pain, at the time of injury. °· Tenderness, inflammation, and later bruising (contusion) of the elbow (within 48 hours). °· Visible deformity, if the fracture is complete, and the bone fragments are not aligned properly (displaced). °· Numbness, coldness, or paralysis in the elbow, forearm, or hand from pressure on the blood vessels or nerves (uncommon). °CAUSES  °An elbow fracture occurs when a force is placed on the bone that is greater than it can handle. Typical causes of injury include: °· Indirect trauma, such as falling on an outstretched hand (most common cause). °· Direct hit (trauma) to the elbow. °· Twisting injury to the elbow. °RISK INCREASES WITH: °· Contact sports (football, rugby). °· Sports in which falling is likely (skating, basketball). °· Children under 12 years of age and adults over 60. °· Bone or joint disease (osteoarthritis, bone tumor). °· Poor strength and flexibility. °PREVENTION  °· Warm up and stretch properly before activity. °· Maintain physical fitness: °· Strength, flexibility, and endurance. °· Cardiovascular fitness. °· When appropriate, wear properly fitted and padded elbow protection. °PROGNOSIS  °If treated properly, elbow fractures often heal within 6 to 8 weeks for adults, and 4 to 6 weeks in children.  °RELATED COMPLICATIONS  °· Fracture does not heal (nonunion). °· Fracture heals in improper alignment (malunion). °· Chronic pain, stiffness, loss of motion, or swelling of the elbow. °· Excessive bleeding in the elbow or at the fracture site, causing pressure and injury to nerves and blood vessels (uncommon). °· Calcification of the soft tissues around the elbow (heterotopic ossification). °· Risk of bone death, due to  interrupted blood supply caused by the fracture. °· Unstable or arthritic joint, following repeated injury. °· Stopping of normal bone growth in children. °· Wasting away (atrophy), weakness, stiffness, numbness, and poor control of the hand, due to injury to blood vessels, nerves, cartilage, muscle, ligaments, and connective tissue sheets (fascia). °TREATMENT  °If the fracture is displaced, it must be put back in proper alignment (reduced) by an individual trained in the procedure. Often, displaced fractures cannot be realigned by hand, and surgery is needed. Once the bones are aligned (with or without surgery), ice and medicine can be used to reduce pain and inflammation. The elbow should be restrained for at least 1 to 2 weeks. After restraint, it is important to complete strengthening and stretching exercises, to regain strength and a full range of motion. Theses exercises may be completed at home or with a therapist.  °MEDICATION  °· If pain medicine is needed, nonsteroidal anti-inflammatory medicines (aspirin and ibuprofen), or other minor pain relievers (acetaminophen), are often advised. °· Do not take pain medicine for 7 days before surgery. °· Prescription pain relievers may be given, if your caregiver thinks they are needed. Use only as directed and only as much as you need. °COLD THERAPY  °Cold treatment (icing) should be applied for 10 to 15 minutes every 2 to 3 hours for inflammation and pain, and immediately after activity that aggravates your symptoms. Use ice packs or an ice massage. °SEEK MEDICAL CARE IF: °· Pain, tenderness, or swelling gets worse, despite treatment. °· You experience pain, numbness, or coldness in the hand. °· Blue, gray, or dark color appears in the   fingernails. °· Any of the following occur after surgery: fever, increased pain, swelling, redness, drainage of fluids, or bleeding in the affected area. °· New, unexplained symptoms develop. (Drugs used in treatment may produce side  effects.) °EXERCISES  °RANGE OF MOTION (ROM) AND STRETCHING EXERCISES - Elbow Fracture (Radial Head) °These exercises may help you restore your elbow mobility once your physician has discontinued your restraint period. Beginning these before your caregiver's approval may result in delayed healing. Your symptoms may go away with or without further involvement from your physician, physical therapist or athletic trainer. While completing these exercises, remember:  °· Restoring tissue flexibility helps normal motion to return to the joints. This allows healthier, less painful movement and activity. °· An effective stretch should be held for at least 30 seconds. A stretch should never be painful. You should only feel a gentle lengthening or release in the stretch. °RANGE OF MOTION  Supination, Active-Assisted °· Sit with your right / left elbow bent at 90 degrees, resting your forearm on a table. °· Keeping your upper body and shoulder in place, roll your forearm so your palm faces upward. When you can go no farther, use your opposite hand to help, until you feel a gentle to moderate stretch. Hold for __________ seconds. °· Slowly release the stretch and return to the starting position. °Repeat __________ times. Complete this exercise __________ times per day. °RANGE OF MOTION  Pronation, Active-Assisted  °· Sit with your right / left elbow bent at 90 degrees, resting your forearm on a table. °· Keeping your upper body and shoulder in place, roll your forearm so your palm faces the tabletop. When you can go no farther, use your opposite hand to help, until you feel a gentle to moderate stretch. Hold for __________ seconds. °· Slowly release the stretch and return to the starting position. °Repeat __________ times. Complete this exercise __________ times per day. °RANGE OF MOTION - Extension °· Hold your right / left arm at your side and straighten your elbow as far as you can, using your right / left arm  muscles. °· Straighten the right / left elbow farther by gently pushing down on your forearm, until you feel a gentle stretch on the inside of your elbow. Hold this position for __________ seconds. °· Slowly return to the starting position. °Repeat __________ times. Complete this exercise __________ times per day.  °RANGE OF MOTION  Flexion °· Hold your right / left arm at your side and bend your elbow as far as you can, using your right / left arm muscles. °· Bend the right / left elbow farther by gently pushing up on your forearm, until you feel a gentle stretch on the outside of your elbow. Hold this position for __________ seconds. °· Slowly return to the starting position. °Repeat __________ times. Complete this exercise __________ times per day.  °RANGE OF MOTION  Supination, Active °· Stand or sit with your elbows at your side. Bend your right / left elbow to 90 degrees. °· Turn your palm upward until you feel a gentle stretch on the inside of your forearm. °· Hold this position for __________ seconds. Slowly release and return to the starting position. °Repeat __________ times. Complete this stretch __________ times per day.  °RANGE OF MOTION  Pronation, Active °· Stand or sit with your elbows at your side. Bend your right / left elbow to 90 degrees. °· Turn your palm downward until you feel a gentle stretch on the top of   your forearm. °· Hold this position for __________ seconds. Slowly release and return to the starting position. °Repeat __________ times. Complete this stretch __________ times per day.  °RANGE OF MOTION  Elbow Flexion, Supine °· Lie on your back. Extend your right / left arm into the air, bracing it with your opposite hand. Allow your right / left arm to relax. °· Let your elbow bend, allowing your hand to fall slowly toward your chest. °· You should feel a gentle stretch along the back of your upper arm and elbow. Your physician, physical therapist or athletic trainer may ask you to hold  a __________ hand weight, to increase the intensity of this stretch. °· Hold for __________ seconds. Slowly return your right / left arm to the upright position. °Repeat __________ times. Complete this exercise __________ times per day. °STRETCH  Elbow flexors  °· Lie on a firm bed or countertop, on your back. Be sure that you are in a comfortable position which will allow you to relax your arm muscles. °· Place a folded towel under your right / left upper arm, so that your elbow and shoulder are at the same height. Extend your arm; your elbow should not rest on the bed or towel. °· Allow the weight of your hand to straighten your elbow. Keep your arm and chest muscles relaxed. Your caregiver may ask you to increase the intensity of your stretch by adding a small wrist or hand weight. °· Hold for __________ seconds. You should feel a stretch on the inside of your elbow. Slowly return to the starting position. °Repeat __________ times. Complete this exercise __________ times per day. °STRENGTHENING EXERCISES - Elbow Fracture (Radial Head) °These exercises may help you restore your elbow mobility once your physician has discontinued your restraint period. Beginning these before your caregiver's approval may result in delayed healing. Your symptoms may go away with or without further involvement from your physician, physical therapist or athletic trainer. While completing these exercises, remember:  °· Restoring tissue flexibility helps normal motion to return to the joints. This allows healthier, less painful movement and activity. °· An effective stretch should be held for at least 30 seconds. A stretch should never be painful. You should only feel a gentle lengthening or release in the stretch. °· You may experience muscle soreness or fatigue, but the pain or discomfort you are trying to eliminate should never worsen during these exercises. If this pain does get worse, stop and make sure you are following the  directions exactly. If the pain is still present after adjustments, discontinue the exercise until you can discuss the trouble with your caregiver. °STRENGTH - Elbow Flexors, Isometric °· Stand or sit upright, on a firm surface. Place your right / left arm so that your hand is palm-up, and at the height of your waist. °· Place your opposite hand on top of your forearm. Gently push down as your right / left arm resists. Push as hard as you can with both arms, without causing any pain or movement at your right / left elbow. Hold this stationary position for __________ seconds. °· Gradually release the tension in both arms. Allow your muscles to relax completely before repeating. °Repeat __________ times. Complete this exercise __________ times per day. °STRENGTH - Elbow Extensors, Isometric °· Stand or sit upright, on a firm surface. Place your right / left arm so that your palm faces your stomach, and it is at the height of your waist. °· Place your opposite   hand on the underside of your forearm. Gently push up as your right / left arm resists. Push as hard as you can with both arms, without causing any pain or movement at your right / left elbow. Hold this stationary position for __________ seconds. °· Gradually release the tension in both arms. Allow your muscles to relax completely before repeating. °Repeat __________ times. Complete this exercise __________ times per day. °STRENGTH  Elbow Flexors, Supinated °· With good posture, stand, or sit on a firm chair without armrests. Allow your right / left arm to rest at your side with your palm facing forward. °· Holding a __________ weight, or gripping a rubber exercise band or tubing, bring your hand toward your shoulder. °· Allow your muscles to control the resistance, as your hand returns to your side. °Repeat __________ times. Complete this exercise __________ times per day.  °STRENGTH - Elbow Extensors, Dynamic °· With good posture, stand, or sit on a firm chair  without armrests. Keeping your upper arms at your side, bring both hands up toward your right / left shoulder, while gripping a rubber exercise band or tubing. Your right / left hand should be just below the other hand. °· Straighten your right / left elbow. Hold for __________ seconds. °· Allow your muscles to control the rubber exercise band, as your hand returns to your shoulder. °Repeat __________ times. Complete this exercise __________ times per day. °STRENGTH  Forearm Supinators  °· Sit with your right / left forearm supported on a table, keeping your elbow below shoulder height. Rest your hand over the edge, palm down. °· Gently grip a hammer or a soup ladle. °· Without moving your elbow, slowly turn your palm and hand upward to a "thumbs-up" position. °· Hold this position for __________ seconds. Slowly return to the starting position. °Repeat __________ times. Complete this exercise __________ times per day.  °STRENGTH  Forearm Pronators °· Sit with your right / left forearm supported on a table, keeping your elbow below shoulder height. Rest your hand over the edge, palm up. °· Gently grip a hammer or a soup ladle. °· Without moving your elbow, slowly turn your palm and hand upward to a "thumbs-up" position. °· Hold this position for __________ seconds. Slowly return to the starting position. °Repeat __________ times. Complete this exercise __________ times per day.  °Document Released: 06/20/2005 Document Revised: 09/12/2011 Document Reviewed: 10/02/2008 °Exit[employer]® Patient Information ©2014 [employer]. ° °

## 2013-10-17 ENCOUNTER — Encounter (HOSPITAL_BASED_OUTPATIENT_CLINIC_OR_DEPARTMENT_OTHER): Payer: Self-pay | Admitting: Emergency Medicine

## 2013-10-17 ENCOUNTER — Emergency Department (HOSPITAL_BASED_OUTPATIENT_CLINIC_OR_DEPARTMENT_OTHER)
Admission: EM | Admit: 2013-10-17 | Discharge: 2013-10-17 | Disposition: A | Discharge status: Home / Self Care | Payer: No Typology Code available for payment source | Attending: Emergency Medicine | Admitting: Emergency Medicine

## 2013-10-17 DIAGNOSIS — S52123A Displaced fracture of head of unspecified radius, initial encounter for closed fracture: Secondary | ICD-10-CM

## 2013-10-17 DIAGNOSIS — F17200 Nicotine dependence, unspecified, uncomplicated: Secondary | ICD-10-CM | POA: Insufficient documentation

## 2013-10-17 DIAGNOSIS — Z880 Allergy status to penicillin: Secondary | ICD-10-CM | POA: Insufficient documentation

## 2013-10-17 DIAGNOSIS — Z8659 Personal history of other mental and behavioral disorders: Secondary | ICD-10-CM | POA: Insufficient documentation

## 2013-10-17 DIAGNOSIS — S42309D Unspecified fracture of shaft of humerus, unspecified arm, subsequent encounter for fracture with routine healing: Secondary | ICD-10-CM | POA: Insufficient documentation

## 2013-10-17 NOTE — ED Notes (Signed)
Seen here 4/12 DX arm fx did not see orth and took splint off , here for increased pain

## 2013-10-17 NOTE — ED Provider Notes (Signed)
Medical screening examination/treatment/procedure(s) were performed by non-physician practitioner and as supervising physician I was immediately available for consultation/collaboration.     Harriet Sutphen, MD 10/17/13 1437 

## 2013-10-17 NOTE — Discharge Instructions (Signed)
Radial Head Fracture A radial head fracture is a break of the radius bone in the forearm. The radial head is the part of the bone near the elbow. These breaks often happen during a fall when you land on the outstretched arm.  HOME CARE  Raise (elevate) the injured part while sitting or lying down.  Put ice on the injured area.  Put ice in a plastic bag.  Place a towel between your skin and the bag.  Leave the ice on for 15-20 minutes, 03-04 times a day.  Move your fingers.  If you have a plaster or fiberglass cast:  Do not try to scratch the skin under the cast.  Check the skin around the cast every day. Put lotion on any red or sore areas if needed.  Keep your cast dry and clean.  If you have a plaster splint:  Wear the splint as told.  Loosen the elastic around the splint if your fingers become numb, tingle, or turn cold or blue.  Do not put pressure on any part of your cast or splint. Rest your cast on a pillow for the first 24 hours.  Protect your cast or splint during bathing with a plastic bag. Do not put the cast or splint in water.  Only take medicine as told by your doctor.  Follow up with your doctor. This is important.  Do not over do exercises. GET HELP RIGHT AWAY IF:   Your cast or splint gets damaged or breaks.  You have more pain or puffiness (swelling) than you did before getting the cast.  You have severe pain when stretching your fingers.  There is a bad smell, new stains or yellowish white fluid (pus) coming from under the cast.  Your fingers or hand turn pale, blue, or become cold or lose feeling (numb). MAKE SURE YOU:  Understand these instructions.  Will watch your condition.  Will get help right away if you are not doing well or get worse. Document Released: 06/08/2009 Document Revised: 09/12/2011 Document Reviewed: 06/08/2009 [facility]Exit[facility] Patient Information 2014 [employer], [place]. Marland Kitchen.  Cast or Splint Care Casts and splints support  injured limbs and keep bones from moving while they heal. It is important to care for your cast or splint at home.  HOME CARE INSTRUCTIONS  Keep the cast or splint uncovered during the drying period. It can take 24 to 48 hours to dry if it is made of plaster. A fiberglass cast will dry in less than 1 hour.  Do not rest the cast on anything harder than a pillow for the first 24 hours.  Do not put weight on your injured limb or apply pressure to the cast until your health care provider gives you permission.  Keep the cast or splint dry. Wet casts or splints can lose their shape and may not support the limb as well. A wet cast that has lost its shape can also create harmful pressure on your skin when it dries. Also, wet skin can become infected.  Cover the cast or splint with a plastic bag when bathing or when out in the rain or snow. If the cast is on the trunk of the body, take sponge baths until the cast is removed.  If your cast does become wet, dry it with a towel or a blow dryer on the cool setting only.  Keep your cast or splint clean. Soiled casts may be wiped with a moistened cloth.  Do not place any hard or  soft foreign objects under your cast or splint, such as cotton, toilet paper, lotion, or powder.  Do not try to scratch the skin under the cast with any object. The object could get stuck inside the cast. Also, scratching could lead to an infection. If itching is a problem, use a blow dryer on a cool setting to relieve discomfort.  Do not trim or cut your cast or remove padding from inside of it.  Exercise all joints next to the injury that are not immobilized by the cast or splint. For example, if you have a long leg cast, exercise the hip joint and toes. If you have an arm cast or splint, exercise the shoulder, elbow, thumb, and fingers.  Elevate your injured arm or leg on 1 or 2 pillows for the first 1 to 3 days to decrease swelling and pain.It is best if you can comfortably  elevate your cast so it is higher than your heart. SEEK MEDICAL CARE IF:   Your cast or splint cracks.  Your cast or splint is too tight or too loose.  You have unbearable itching inside the cast.  Your cast becomes wet or develops a soft spot or area.  You have a bad smell coming from inside your cast.  You get an object stuck under your cast.  Your skin around the cast becomes red or raw.  You have new pain or worsening pain after the cast has been applied. SEEK IMMEDIATE MEDICAL CARE IF:   You have fluid leaking through the cast.  You are unable to move your fingers or toes.  You have discolored (blue or white), cool, painful, or very swollen fingers or toes beyond the cast.  You have tingling or numbness around the injured area.  You have severe pain or pressure under the cast.  You have any difficulty with your breathing or have shortness of breath.  You have chest pain. Document Released: 06/17/2000 Document Revised: 04/10/2013 Document Reviewed: 12/27/2012 [facility] InstituteExit[facility] Patient Information 2014 [employer]Care, [place]LL[employer].

## 2013-10-17 NOTE — ED Provider Notes (Signed)
CSN: [id number]     Arrival date & time 10/17/13  1326 History   First MD Initiated Contact with Patient 10/17/13 1329     Chief Complaint  Patient presents with  . Arm Injury     (Consider location/radiation/quality/duration/timing/severity/associated sxs/prior Treatment) HPI Comments: Pt was seen here on 4/12 and was diagnosed with radial head fracture. Pt states that she took the splint off yesterday and the pain is bad again. Pt states that the orthopedist did not accept her insurance. Denies numbness or weakness  The history is provided by the patient. No language interpreter was used.    Past Medical History  Diagnosis Date  . Bipolar 1 disorder    Past Surgical History  Procedure Laterality Date  . Tubal ligation     History reviewed. No pertinent family history. History  Substance Use Topics  . Smoking status: Current Every Day Smoker  . Smokeless tobacco: Not on file  . Alcohol Use: Yes     Comment: occ   OB History   Grav Para Term Preterm Abortions TAB SAB Ect Mult Living                 Review of Systems  Constitutional: Negative.   Respiratory: Negative.   Cardiovascular: Negative.       Allergies  Keflex and Penicillins cross reactors  Home Medications   Prior to Admission medications   Medication Sig Start Date End Date Taking? Authorizing Provider  oxyCODONE-acetaminophen (PERCOCET) 5-325 MG per tablet Take 2 tablets by mouth every 6 (six) hours as needed for moderate pain. 10/13/13   Dagmar HaitWilliam Blair Walden, MD   BP 137/76  Pulse 97  Temp(Src) 98.6 F (37 C) (Oral)  Resp 18  Ht 5\' 3"  (1.6 m)  Wt 175 lb (79.379 kg)  BMI 31.01 kg/m2  SpO2 100%  LMP 10/07/2013 Physical Exam  Nursing note and vitals reviewed. Constitutional: She appears well-developed and well-nourished.  Cardiovascular: Normal rate and regular rhythm.   Pulmonary/Chest: Effort normal and breath sounds normal.  Musculoskeletal:  Swelling noted to the left elbow. Pt has  full rom. Neurovascularly intact    ED Course  Procedures (including critical care time) Labs Review Labs Reviewed - No data to display  Imaging Review No results found.   EKG Interpretation None      MDM   Final diagnoses:  Radial head fracture    Pt neurovascularly intact.pt resplinted and we discussed how to get ortho that accepts her insurance   Teressa LowerVrinda Airyn Ellzey, NP 10/17/13 1402

## 2014-10-29 ENCOUNTER — Encounter (HOSPITAL_BASED_OUTPATIENT_CLINIC_OR_DEPARTMENT_OTHER): Payer: Self-pay | Admitting: *Deleted

## 2014-10-29 ENCOUNTER — Emergency Department (HOSPITAL_BASED_OUTPATIENT_CLINIC_OR_DEPARTMENT_OTHER)
Admission: EM | Admit: 2014-10-29 | Discharge: 2014-10-29 | Disposition: A | Discharge status: Home / Self Care | Payer: No Typology Code available for payment source | Attending: Emergency Medicine | Admitting: Emergency Medicine

## 2014-10-29 DIAGNOSIS — Z720 Tobacco use: Secondary | ICD-10-CM | POA: Insufficient documentation

## 2014-10-29 DIAGNOSIS — Z880 Allergy status to penicillin: Secondary | ICD-10-CM | POA: Insufficient documentation

## 2014-10-29 DIAGNOSIS — L0231 Cutaneous abscess of buttock: Secondary | ICD-10-CM | POA: Insufficient documentation

## 2014-10-29 DIAGNOSIS — Z8659 Personal history of other mental and behavioral disorders: Secondary | ICD-10-CM | POA: Insufficient documentation

## 2014-10-29 MED ORDER — LIDOCAINE-EPINEPHRINE (PF) 2 %-1:200000 IJ SOLN
10.0000 mL | Freq: Once | INTRAMUSCULAR | Status: AC
  Administered 2014-10-29: 10 mL via INTRADERMAL
  Filled 2014-10-29: qty 10

## 2014-10-29 NOTE — ED Notes (Signed)
Pt c/o abscess to left buttocks x 3 days

## 2014-10-29 NOTE — ED Provider Notes (Signed)
CSN: [id number]     Arrival date & time 10/29/14  1630 History   First MD Initiated Contact with Patient 10/29/14 1639     Chief Complaint  Patient presents with  . Abscess     (Consider location/radiation/quality/duration/timing/severity/associated sxs/prior Treatment) HPI Comments: 29 year old female complaining of a possible abscess to her left buttock that has been present for 3 days. States she stated a hotel 3 days ago and believes she may have been bit by bedbugs. She had bumps on her arms and legs, however states those went away, however the area on her bottom did not go away. She has been applying hydrocortisone cream with no relief. Denies drainage. States the area has gotten larger and more painful, especially when she sits. No fevers.  Patient is a 29 y.o. female presenting with abscess. The history is provided by the patient.  Abscess   Past Medical History  Diagnosis Date  . Bipolar 1 disorder    Past Surgical History  Procedure Laterality Date  . Tubal ligation     History reviewed. No pertinent family history. History  Substance Use Topics  . Smoking status: Current Every Day Smoker -- 0.50 packs/day    Types: Cigarettes  . Smokeless tobacco: Not on file  . Alcohol Use: Yes     Comment: occ   OB History    No data available     Review of Systems  Constitutional: Negative.   HENT: Negative.   Respiratory: Negative.   Cardiovascular: Negative.   Musculoskeletal: Negative.   Skin:       + Abscess.      Allergies  Keflex and Penicillins cross reactors  Home Medications   Prior to Admission medications   Not on File   BP 142/68 mmHg  Pulse 74  Temp(Src) 98.7 F (37.1 C)  Resp 16  Wt 175 lb (79.379 kg)  SpO2 100%  LMP 10/15/2014 Physical Exam  Constitutional: She is oriented to person, place, and time. She appears well-developed and well-nourished. No distress.  HENT:  Head: Normocephalic and atraumatic.  Mouth/Throat: Oropharynx is clear  and moist.  Eyes: Conjunctivae and EOM are normal.  Neck: Normal range of motion. Neck supple.  Cardiovascular: Normal rate, regular rhythm and normal heart sounds.   Pulmonary/Chest: Effort normal and breath sounds normal. No respiratory distress.  Musculoskeletal: Normal range of motion. She exhibits no edema.  Neurological: She is alert and oriented to person, place, and time. No sensory deficit.  Skin: Skin is warm and dry.  1 cm area of fluctuance on left buttock with 1 cm area of surrounding erythema. Warm, tender with central pustule. No drainage.  Psychiatric: She has a normal mood and affect. Her behavior is normal.  Nursing note and vitals reviewed.   ED Course  Procedures (including critical care time) INCISION AND DRAINAGE Performed by: Celene Skeenobyn Jazsmin Couse Consent: Verbal consent obtained. Risks and benefits: risks, benefits and alternatives were discussed Type: abscess  Body area: left buttock area  Anesthesia: local infiltration  Incision was made with a scalpel.  Local anesthetic: lidocaine 2% with epinephrine  Anesthetic total: 2 ml  Complexity: complex Blunt dissection to break up loculations  Drainage: purulent  Drainage amount: small  Packing material: none  Patient tolerance: Patient tolerated the procedure well with no immediate complications.   Labs Review Labs Reviewed - No data to display  Imaging Review No results found.   EKG Interpretation None      MDM   Final diagnoses:  Left  buttock abscess    Nontoxic appearing, NAD. Afebrile. Abscess without cellulitis. Abscess drained. Small amount of drainage. Advised warm compresses. Follow-up with PCP. Stable for discharge. Return precautions given. Patient states understanding of treatment care plan and is agreeable.    Kathrynn Speed, PA-C 10/29/14 1726  Gwyneth Sprout, MD 10/29/14 [phone]

## 2014-10-29 NOTE — Discharge Instructions (Signed)
Abscess °An abscess is an infected area that contains a collection of pus and debris. It can occur in almost any part of the body. An abscess is also known as a furuncle or boil. °CAUSES  °An abscess occurs when tissue gets infected. This can occur from blockage of oil or sweat glands, infection of hair follicles, or a minor injury to the skin. As the body tries to fight the infection, pus collects in the area and creates pressure under the skin. This pressure causes pain. People with weakened immune systems have difficulty fighting infections and get certain abscesses more often.  °SYMPTOMS °Usually an abscess develops on the skin and becomes a painful mass that is red, warm, and tender. If the abscess forms under the skin, you may feel a moveable soft area under the skin. Some abscesses break open (rupture) on their own, but most will continue to get worse without care. The infection can spread deeper into the body and eventually into the bloodstream, causing you to feel ill.  °DIAGNOSIS  °Your caregiver will take your medical history and perform a physical exam. A sample of fluid may also be taken from the abscess to determine what is causing your infection. °TREATMENT  °Your caregiver may prescribe antibiotic medicines to fight the infection. However, taking antibiotics alone usually does not cure an abscess. Your caregiver may need to make a small cut (incision) in the abscess to drain the pus. In some cases, gauze is packed into the abscess to reduce pain and to continue draining the area. °HOME CARE INSTRUCTIONS  °· Only take over-the-counter or prescription medicines for pain, discomfort, or fever as directed by your caregiver. °· If you were prescribed antibiotics, take them as directed. Finish them even if you start to feel better. °· If gauze is used, follow your caregiver's directions for changing the gauze. °· To avoid spreading the infection: °· Keep your draining abscess covered with a  bandage. °· Wash your hands well. °· Do not share personal care items, towels, or whirlpools with others. °· Avoid skin contact with others. °· Keep your skin and clothes clean around the abscess. °· Keep all follow-up appointments as directed by your caregiver. °SEEK MEDICAL CARE IF:  °· You have increased pain, swelling, redness, fluid drainage, or bleeding. °· You have muscle aches, chills, or a general ill feeling. °· You have a fever. °MAKE SURE YOU:  °· Understand these instructions. °· Will watch your condition. °· Will get help right away if you are not doing well or get worse. °Document Released: 03/30/2005 Document Revised: 12/20/2011 Document Reviewed: 09/02/2011 °ExitCare® Patient Information ©2015 [employer]. This information is not intended to replace advice given to you by your health care provider. Make sure you discuss any questions you have with your health care provider. ° °Abscess °Care After °An abscess (also called a boil or furuncle) is an infected area that contains a collection of pus. Signs and symptoms of an abscess include pain, tenderness, redness, or hardness, or you may feel a moveable soft area under your skin. An abscess can occur anywhere in the body. The infection may spread to surrounding tissues causing cellulitis. A cut (incision) by the surgeon was made over your abscess and the pus was drained out. Gauze may have been packed into the space to provide a drain that will allow the cavity to heal from the inside outwards. The boil may be painful for 5 to 7 days. Most people with a boil do not have   high fevers. Your abscess, if seen early, may not have localized, and may not have been lanced. If not, another appointment may be required for this if it does not get better on its own or with medications. °HOME CARE INSTRUCTIONS  °· Only take over-the-counter or prescription medicines for pain, discomfort, or fever as directed by your caregiver. °· When you bathe, soak and then  remove gauze or iodoform packs at least daily or as directed by your caregiver. You may then wash the wound gently with mild soapy water. Repack with gauze or do as your caregiver directs. °SEEK IMMEDIATE MEDICAL CARE IF:  °· You develop increased pain, swelling, redness, drainage, or bleeding in the wound site. °· You develop signs of generalized infection including muscle aches, chills, fever, or a general ill feeling. °· An oral temperature above 102° F (38.9° C) develops, not controlled by medication. °See your caregiver for a recheck if you develop any of the symptoms described above. If medications (antibiotics) were prescribed, take them as directed. °Document Released: 01/06/2005 Document Revised: 09/12/2011 Document Reviewed: 09/03/2007 °ExitCare® Patient Information ©2015 [employer]. This information is not intended to replace advice given to you by your health care provider. Make sure you discuss any questions you have with your health care provider. ° °

## 2014-10-30 ENCOUNTER — Emergency Department (HOSPITAL_BASED_OUTPATIENT_CLINIC_OR_DEPARTMENT_OTHER)
Admission: EM | Admit: 2014-10-30 | Discharge: 2014-10-30 | Disposition: A | Discharge status: Home / Self Care | Payer: No Typology Code available for payment source | Attending: Emergency Medicine | Admitting: Emergency Medicine

## 2014-10-30 ENCOUNTER — Encounter (HOSPITAL_BASED_OUTPATIENT_CLINIC_OR_DEPARTMENT_OTHER): Payer: Self-pay | Admitting: *Deleted

## 2014-10-30 DIAGNOSIS — Z8659 Personal history of other mental and behavioral disorders: Secondary | ICD-10-CM | POA: Insufficient documentation

## 2014-10-30 DIAGNOSIS — Z720 Tobacco use: Secondary | ICD-10-CM | POA: Insufficient documentation

## 2014-10-30 DIAGNOSIS — R630 Anorexia: Secondary | ICD-10-CM | POA: Insufficient documentation

## 2014-10-30 DIAGNOSIS — Z880 Allergy status to penicillin: Secondary | ICD-10-CM | POA: Insufficient documentation

## 2014-10-30 DIAGNOSIS — R000 Tachycardia, unspecified: Secondary | ICD-10-CM | POA: Insufficient documentation

## 2014-10-30 DIAGNOSIS — L0231 Cutaneous abscess of buttock: Secondary | ICD-10-CM | POA: Insufficient documentation

## 2014-10-30 MED ORDER — SULFAMETHOXAZOLE-TRIMETHOPRIM 800-160 MG PO TABS: 1.0000 | ORAL_TABLET | Freq: Two times a day (BID) | ORAL | Status: AC

## 2014-10-30 MED ORDER — HYDROCODONE-ACETAMINOPHEN 5-325 MG PO TABS: 1.0000 | ORAL_TABLET | ORAL | Status: DC | PRN

## 2014-10-30 MED ORDER — DOXYCYCLINE HYCLATE 100 MG PO CAPS: 100.0000 mg | ORAL_CAPSULE | Freq: Two times a day (BID) | ORAL | Status: DC

## 2014-10-30 MED ORDER — OXYCODONE-ACETAMINOPHEN 5-325 MG PO TABS: 2.0000 | ORAL_TABLET | Freq: Once | ORAL | Status: DC

## 2014-10-30 MED ORDER — ONDANSETRON HCL 4 MG/2ML IJ SOLN
INTRAMUSCULAR | Status: AC
Start: 2014-10-30 — End: 2014-10-30
  Administered 2014-10-30: 4 mg
  Filled 2014-10-30: qty 2

## 2014-10-30 MED ORDER — SODIUM CHLORIDE 0.9 % IV BOLUS (SEPSIS)
500.0000 mL | Freq: Once | INTRAVENOUS | Status: AC
  Administered 2014-10-30: 500 mL via INTRAVENOUS

## 2014-10-30 MED ORDER — ACETAMINOPHEN 325 MG PO TABS
650.0000 mg | ORAL_TABLET | Freq: Once | ORAL | Status: AC
  Administered 2014-10-30: 650 mg via ORAL
  Filled 2014-10-30: qty 2

## 2014-10-30 MED ORDER — MORPHINE SULFATE 4 MG/ML IJ SOLN
4.0000 mg | Freq: Once | INTRAMUSCULAR | Status: AC
Start: 2014-10-30 — End: 2014-10-30
  Administered 2014-10-30: 4 mg via INTRAVENOUS
  Filled 2014-10-30: qty 1

## 2014-10-30 MED ORDER — LIDOCAINE-EPINEPHRINE (PF) 2 %-1:200000 IJ SOLN
10.0000 mL | Freq: Once | INTRAMUSCULAR | Status: AC
  Administered 2014-10-30: 10 mL via INTRADERMAL
  Filled 2014-10-30: qty 10

## 2014-10-30 MED ORDER — MORPHINE SULFATE 4 MG/ML IJ SOLN
4.0000 mg | Freq: Once | INTRAMUSCULAR | Status: AC
  Administered 2014-10-30: 4 mg via INTRAVENOUS
  Filled 2014-10-30: qty 1

## 2014-10-30 NOTE — Discharge Instructions (Signed)
Take Vicodin for severe pain only. No driving or operating heavy machinery while taking vicodin. This medication may cause drowsiness. Take both antibiotics to completion. Apply warm compresses.  Abscess An abscess is an infected area that contains a collection of pus and debris.It can occur in almost any part of the body. An abscess is also known as a furuncle or boil. CAUSES  An abscess occurs when tissue gets infected. This can occur from blockage of oil or sweat glands, infection of hair follicles, or a minor injury to the skin. As the body tries to fight the infection, pus collects in the area and creates pressure under the skin. This pressure causes pain. People with weakened immune systems have difficulty fighting infections and get certain abscesses more often.  SYMPTOMS Usually an abscess develops on the skin and becomes a painful mass that is red, warm, and tender. If the abscess forms under the skin, you may feel a moveable soft area under the skin. Some abscesses break open (rupture) on their own, but most will continue to get worse without care. The infection can spread deeper into the body and eventually into the bloodstream, causing you to feel ill.  DIAGNOSIS  Your caregiver will take your medical history and perform a physical exam. A sample of fluid may also be taken from the abscess to determine what is causing your infection. TREATMENT  Your caregiver may prescribe antibiotic medicines to fight the infection. However, taking antibiotics alone usually does not cure an abscess. Your caregiver may need to make a small cut (incision) in the abscess to drain the pus. In some cases, gauze is packed into the abscess to reduce pain and to continue draining the area. HOME CARE INSTRUCTIONS   Only take over-the-counter or prescription medicines for pain, discomfort, or fever as directed by your caregiver.  If you were prescribed antibiotics, take them as directed. Finish them even if you  start to feel better.  If gauze is used, follow your caregiver's directions for changing the gauze.  To avoid spreading the infection:  Keep your draining abscess covered with a bandage.  Wash your hands well.  Do not share personal care items, towels, or whirlpools with others.  Avoid skin contact with others.  Keep your skin and clothes clean around the abscess.  Keep all follow-up appointments as directed by your caregiver. SEEK MEDICAL CARE IF:   You have increased pain, swelling, redness, fluid drainage, or bleeding.  You have muscle aches, chills, or a general ill feeling.  You have a fever. MAKE SURE YOU:   Understand these instructions.  Will watch your condition.  Will get help right away if you are not doing well or get worse. Document Released: 03/30/2005 Document Revised: 12/20/2011 Document Reviewed: 09/02/2011 [facility] Patient Information 2015 [place], [place]. This information is not intended to replace advice given to you by your health care provider. Make sure you discuss any questions you have with your health care provider.  Cellulitis Cellulitis is an infection of the skin and the tissue beneath it. The infected area is usually red and tender. Cellulitis occurs most often in the arms and lower legs.  CAUSES  Cellulitis is caused by bacteria that enter the skin through cracks or cuts in the skin. The most common types of bacteria that cause cellulitis are staphylococci and streptococci. SIGNS AND SYMPTOMS   Redness and warmth.  Swelling.  Tenderness or pain.  Fever. DIAGNOSIS  Your health care provider can usually determine what is  wrong based on a physical exam. Blood tests may also be done. TREATMENT  Treatment usually involves taking an antibiotic medicine. HOME CARE INSTRUCTIONS   Take your antibiotic medicine as directed by your health care provider. Finish the antibiotic even if you start to feel better.  Keep the infected arm or leg  elevated to reduce swelling.  Apply a warm cloth to the affected area up to 4 times per day to relieve pain.  Take medicines only as directed by your health care provider.  Keep all follow-up visits as directed by your health care provider. SEEK MEDICAL CARE IF:   You notice red streaks coming from the infected area.  Your red area gets larger or turns dark in color.  Your bone or joint underneath the infected area becomes painful after the skin has healed.  Your infection returns in the same area or another area.  You notice a swollen bump in the infected area.  You develop new symptoms.  You have a fever. SEEK IMMEDIATE MEDICAL CARE IF:   You feel very sleepy.  You develop vomiting or diarrhea.  You have a general ill feeling (malaise) with muscle aches and pains. MAKE SURE YOU:   Understand these instructions.  Will watch your condition.  Will get help right away if you are not doing well or get worse. Document Released: 03/30/2005 Document Revised: 11/04/2013 Document Reviewed: 09/05/2011 [facility] Patient Information 2015 [place], [place]. This information is not intended to replace advice given to you by your health care provider. Make sure you discuss any questions you have with your health care provider.

## 2014-10-30 NOTE — ED Notes (Signed)
Pt amb to triage, crying, unable to sit in chair. Pt reports "maybe a bug bite to her left buttock one week ago." seen here yesterday and "they didn't do nothing for me" pt reports no rx, this rn observes area larger than a softball, red, hard, tender to touch, hot area to left buttock. Pt states "they tried to cut it open yesterday, but couldn't do it." pt reports chills, body aches, no appetite and unable to sit x last night.

## 2014-10-30 NOTE — ED Notes (Signed)
20g IV in Rt AC removed per order, tip intact, site WNL, bandaid applied

## 2014-10-30 NOTE — ED Provider Notes (Signed)
CSN: [id number]     Arrival date & time 10/30/14  1534 History   First MD Initiated Contact with Patient 10/30/14 1550     Chief Complaint  Patient presents with  . Skin Problem     (Consider location/radiation/quality/duration/timing/severity/associated sxs/prior Treatment) HPI Comments: 29 y/o F c/o worsening abscess to L buttock since being seen by me in the ED yesterday. Pt had an I&D performed with a small amount of drainage noted. No cellulitis present at the time and was not d/c home with abx. The initial abscess presented after possibly being bit by a bedbug one week ago. States the area has gotten larger, extremely tender, warm, and can barely sit. Pain 10/10. Admits to subjective fever, chills, body aches and no appetite beginning after leaving the ED last night. No vomiting. Denies any drainage from the abscess.  Patient is a 29 y.o. female presenting with abscess. The history is provided by the patient.  Abscess Abscess location: buttock. Size:  Large Abscess quality: painful and warmth   Red streaking: no   Progression:  Worsening Pain details:    Quality:  Throbbing and pressure   Severity:  Severe   Timing:  Constant   Progression:  Worsening Context: insect bite/sting   Relieved by:  Nothing Exacerbated by: sitting. Associated symptoms: fever   Associated symptoms: no vomiting     Past Medical History  Diagnosis Date  . Bipolar 1 disorder    Past Surgical History  Procedure Laterality Date  . Tubal ligation     History reviewed. No pertinent family history. History  Substance Use Topics  . Smoking status: Current Every Day Smoker -- 0.50 packs/day    Types: Cigarettes  . Smokeless tobacco: Not on file  . Alcohol Use: Yes     Comment: occ   OB History    No data available     Review of Systems  Constitutional: Positive for fever, chills and appetite change.  HENT: Negative.   Respiratory: Negative.   Cardiovascular: Negative.   Gastrointestinal:  Negative for vomiting.  Skin: Positive for color change.       + Abscess.  All other systems reviewed and are negative.     Allergies  Keflex and Penicillins cross reactors  Home Medications   Prior to Admission medications   Medication Sig Start Date End Date Taking? Authorizing Provider  doxycycline (VIBRAMYCIN) 100 MG capsule Take 1 capsule (100 mg total) by mouth 2 (two) times daily. One po bid x 7 days 10/30/14   Kathrynn Speed, PA-C  HYDROcodone-acetaminophen (NORCO/VICODIN) 5-325 MG per tablet Take 1-2 tablets by mouth every 4 (four) hours as needed. 10/30/14   Jillene Wehrenberg M Kaden Dunkel, PA-C  sulfamethoxazole-trimethoprim (BACTRIM DS,SEPTRA DS) 800-160 MG per tablet Take 1 tablet by mouth 2 (two) times daily. 10/30/14 11/06/14  Delton Stelle M Darbi Chandran, PA-C   BP 114/60 mmHg  Pulse 78  Temp(Src) 100 F (37.8 C) (Oral)  Resp 18  SpO2 100%  LMP 10/15/2014 Physical Exam  Constitutional: She is oriented to person, place, and time. She appears well-developed and well-nourished. No distress.  HENT:  Head: Normocephalic and atraumatic.  Mouth/Throat: Oropharynx is clear and moist.  Eyes: Conjunctivae and EOM are normal.  Neck: Normal range of motion. Neck supple.  Cardiovascular: Regular rhythm and normal heart sounds.   Tachycardic.  Pulmonary/Chest: Effort normal and breath sounds normal. No respiratory distress.  Musculoskeletal: Normal range of motion. She exhibits no edema.  Neurological: She is alert and oriented to person,  place, and time. No sensory deficit.  Skin: Skin is warm and dry.     Psychiatric: She has a normal mood and affect. Her behavior is normal.  Nursing note and vitals reviewed.   ED Course  Procedures (including critical care time) INCISION AND DRAINAGE Performed by: Celene Skeenobyn Abdelaziz Westenberger Consent: Verbal consent obtained. Risks and benefits: risks, benefits and alternatives were discussed Type: abscess  Body area: left buttock  Anesthesia: local infiltration  Incision was made  with a scalpel.  Local anesthetic: lidocaine 2% with epinephrine  Anesthetic total: 2 ml  Complexity: complex Blunt dissection to break up loculations  Drainage: purulent  Drainage amount: large  Packing material: 1/4 in iodoform gauze  Patient tolerance: Patient tolerated the procedure well with no immediate complications.   Labs Review Labs Reviewed - No data to display  Imaging Review No results found.   EKG Interpretation None      MDM   Final diagnoses:  Left buttock abscess   Non-toxic appearing, uncomfortable but in NAD. Temp 100, tachycardic on arrival. Give IV fluids, tylenol and morphine for pain control. Worsening abscess. Abscess drained with large amount of drainage. Given surrounding cellulitis, fever, and worsening symptoms, will start pt on bactrim and doxy. Vicodin for pain. Advised warm compresses. Vitals significantly improved after IV fluids. F/u with PCP. Return precautions given. Patient states understanding of treatment care plan and is agreeable.   Kathrynn SpeedRobyn M Swan [place], PA-C 10/30/14 1702  Nelva Nayobert Beaton, MD 10/30/14 [phone]

## 2014-10-30 NOTE — ED Notes (Signed)
Time-Out performed prior to bedside procedure

## 2015-04-08 ENCOUNTER — Other Ambulatory Visit (HOSPITAL_COMMUNITY): Payer: Self-pay | Admitting: *Deleted

## 2015-04-08 DIAGNOSIS — N644 Mastodynia: Secondary | ICD-10-CM

## 2015-04-08 DIAGNOSIS — N6452 Nipple discharge: Secondary | ICD-10-CM

## 2015-04-17 ENCOUNTER — Ambulatory Visit (HOSPITAL_COMMUNITY): Payer: No Typology Code available for payment source

## 2015-04-17 ENCOUNTER — Other Ambulatory Visit: Payer: No Typology Code available for payment source

## 2015-04-23 ENCOUNTER — Ambulatory Visit
Admission: RE | Admit: 2015-04-23 | Discharge: 2015-04-23 | Disposition: A | Discharge status: Home / Self Care | Payer: No Typology Code available for payment source | Source: Ambulatory Visit | Attending: Obstetrics and Gynecology | Admitting: Obstetrics and Gynecology

## 2015-04-23 ENCOUNTER — Ambulatory Visit (HOSPITAL_COMMUNITY)
Admission: RE | Admit: 2015-04-23 | Discharge: 2015-04-23 | Disposition: A | Discharge status: Home / Self Care | Payer: No Typology Code available for payment source | Source: Ambulatory Visit | Attending: Obstetrics and Gynecology | Admitting: Obstetrics and Gynecology

## 2015-04-23 ENCOUNTER — Encounter (HOSPITAL_COMMUNITY): Payer: Self-pay

## 2015-04-23 VITALS — BP 114/84 | Temp 99.0°F | Ht 64.0 in | Wt 173.0 lb

## 2015-04-23 DIAGNOSIS — N6452 Nipple discharge: Secondary | ICD-10-CM

## 2015-04-23 DIAGNOSIS — N644 Mastodynia: Secondary | ICD-10-CM

## 2015-04-23 DIAGNOSIS — Z01419 Encounter for gynecological examination (general) (routine) without abnormal findings: Secondary | ICD-10-CM

## 2015-04-23 NOTE — Progress Notes (Signed)
Complaints of right nipple discharge x 4.5 years after she gave birth to her last child  that is creamy color when expresses herself. Complaints of right breast pain that comes and goes. Patient rates pain at a 6-7 out of 10. Patient stated she has redness within the right breast that comes and goes. She stated she had a lump within her right breast that disappeared 2 years ago.  Pap Smear: Completed Pap smear today. Last Pap smear was in 2010 at Dr. Frutoso [place]'s office and abnormal per patient. Patient stated a colposcopy was recommended by she never followed-up. Patient stated she thinks she may have had a Pap smear in 2012 at [facility] after she had her baby but no results were given to her. Per patient she only has a history of one abnormal Pap smear in 2010. No Pap smear results in EPIC.  Physical exam: Breasts Breasts symmetrical. No skin abnormalities left breast. Observed a red bump on the right outer breast around 11 o'clock. No nipple retraction bilateral breasts. No nipple discharge left breast. Expressed white milky appearing discharge from the right nipple. Sample of discharge sent to cytology. No lymphadenopathy. No lumps palpated bilateral breasts. Complaints of soreness when palpated right outer breast. [facility]. Appointment scheduled for Thursday, April 23, 2015 at 1000.          Pelvic/Bimanual   Ext Genitalia No lesions, no swelling and no discharge observed on external genitalia.         Vagina Vagina pink and normal texture. No lesions or discharge observed in vagina.          Cervix Cervix is present. Cervix pink and of normal texture. No discharge observed.        Uterus Uterus is present and palpable. Uterus in normal position and normal size.       Adnexae Bilateral ovaries present and palpable. No tenderness on palpation.        Rectovaginal No rectal exam completed today since patient had no  rectal complaints. No skin abnormalities observed on exam.  Smoking cessation discussed with patient. Referred patient to the [facility] and resources given to free classes offered at the [facility].

## 2015-04-23 NOTE — Patient Instructions (Addendum)
Educational materials on breast self-awareness given. Explained to DTE Energy CompanyDinita Puglia that follow-up to today's Pap smear will be based on the results to her Pap smear. [facility]. Appointment scheduled for Thursday, April 23, 2015 at 1000. Patient aware of appointment and will be there. Let patient know will follow up with her within the next couple weeks with results of Pap smear and breast discharge. Smoking cessation discussed with patient. Referred patient to the [facility] and resources given to free classes offered at the [facility]. Nora Degan verbalized understanding.  Brannock, Kathaleen Maserhristine Poll, RN 8:51 AM

## 2015-04-23 NOTE — Addendum Note (Signed)
Encounter addended by: Lynnell DikeSabrina H Artelia Game, LPN on: 82/95/621310/20/2016  1:16 PM<BR>     Documentation filed: [employer], Orders

## 2015-04-27 LAB — CYTOLOGY - PAP

## 2015-05-27 ENCOUNTER — Telehealth (HOSPITAL_COMMUNITY): Payer: Self-pay | Admitting: *Deleted

## 2015-05-27 NOTE — Telephone Encounter (Signed)
Telephoned patient at home # and discussed negative pap smear results. HPV was negative. Breast discharge was negative. Next pap smear due in one year due to history of abnormal pap smear. Patient voiced understanding.

## 2015-07-05 ENCOUNTER — Encounter (HOSPITAL_BASED_OUTPATIENT_CLINIC_OR_DEPARTMENT_OTHER): Payer: Self-pay

## 2015-07-05 ENCOUNTER — Emergency Department (HOSPITAL_BASED_OUTPATIENT_CLINIC_OR_DEPARTMENT_OTHER)
Admission: EM | Admit: 2015-07-05 | Discharge: 2015-07-05 | Disposition: A | Discharge status: Home / Self Care | Payer: Self-pay | Attending: Emergency Medicine | Admitting: Emergency Medicine

## 2015-07-05 DIAGNOSIS — L03115 Cellulitis of right lower limb: Secondary | ICD-10-CM | POA: Insufficient documentation

## 2015-07-05 DIAGNOSIS — F17210 Nicotine dependence, cigarettes, uncomplicated: Secondary | ICD-10-CM | POA: Insufficient documentation

## 2015-07-05 DIAGNOSIS — Z880 Allergy status to penicillin: Secondary | ICD-10-CM | POA: Insufficient documentation

## 2015-07-05 DIAGNOSIS — Z8659 Personal history of other mental and behavioral disorders: Secondary | ICD-10-CM | POA: Insufficient documentation

## 2015-07-05 MED ORDER — HYDROCODONE-ACETAMINOPHEN 5-325 MG PO TABS: 1.0000 | ORAL_TABLET | Freq: Once | ORAL | Status: DC

## 2015-07-05 MED ORDER — ACETAMINOPHEN 500 MG PO TABS
1000.0000 mg | ORAL_TABLET | Freq: Once | ORAL | Status: AC
  Administered 2015-07-05: 1000 mg via ORAL
  Filled 2015-07-05: qty 2

## 2015-07-05 MED ORDER — DOXYCYCLINE HYCLATE 100 MG PO CAPS: 100.0000 mg | ORAL_CAPSULE | Freq: Two times a day (BID) | ORAL | Status: DC

## 2015-07-05 NOTE — ED Notes (Signed)
Patient here with red inflamed and painful area to right upper leg, has had abscess in the past. No drainage

## 2015-07-05 NOTE — Discharge Instructions (Signed)

## 2015-07-05 NOTE — ED Provider Notes (Signed)
CSN: [id number]     Arrival date & time 07/05/15  1210 History   First MD Initiated Contact with Patient 07/05/15 1303     Chief Complaint  Patient presents with  . Abscess   HPI  [name] is a 30 year old female with PMHx of bipolar presenting with pain of the right outer thigh and concern for abscess. She first noted a "bug bite area" to the lateral upper thigh of the right leg approximately one week ago. She states that it had a white purulence which she popped. Since this time the area has become more painful and turned red. She has tried home boil ease on the area but this has not helped. The pain is exacerbated by touch. She states that when you touch the area, the pain is so severe that it makes her nauseous. She reports a history of abscesses. Denies fevers, chills, dizziness, syncope, vomiting, abdominal pain, diarrhea or myalgias. She does not have a primary care doctor.  Past Medical History  Diagnosis Date  . Bipolar 1 disorder [facility](HCC)    Past Surgical History  Procedure Laterality Date  . Tubal ligation     Family History  Problem Relation Age of Onset  . Cancer Mother     cervical  . Cancer Maternal Grandmother     uterine  . Hypertension Maternal Grandfather   . Diabetes Maternal Grandfather    Social History  Substance Use Topics  . Smoking status: Current Every Day Smoker -- 0.50 packs/day    Types: Cigarettes  . Smokeless tobacco: None  . Alcohol Use: Yes     Comment: occ   OB History    Gravida Para Term Preterm AB TAB SAB Ectopic Multiple Living   7 3 3  4 2 2   3      Review of Systems  Skin: Positive for color change.  All other systems reviewed and are negative.     Allergies  Keflex and Penicillins cross reactors  Home Medications   Prior to Admission medications   Medication Sig Start Date End Date Taking? Authorizing Provider  doxycycline (VIBRAMYCIN) 100 MG capsule Take 1 capsule (100 mg total) by mouth 2 (two) times daily. 07/05/15    Shaddai Shapley, PA-C   BP 122/64 mmHg  Pulse 87  Temp(Src) 98.2 F (36.8 C) (Oral)  Resp 18  Ht 5\' 4"  (1.626 m)  Wt 79.379 kg  BMI 30.02 kg/m2  SpO2 100% Physical Exam  Constitutional: She appears well-developed and well-nourished. No distress.  HENT:  Head: Normocephalic and atraumatic.  Eyes: Conjunctivae are normal. Right eye exhibits no discharge. Left eye exhibits no discharge. No scleral icterus.  Neck: Normal range of motion.  Cardiovascular: Normal rate and regular rhythm.   Pulmonary/Chest: Effort normal. No respiratory distress.  Abdominal: Soft. There is no tenderness.  Musculoskeletal: Normal range of motion.  Neurological: She is alert. Coordination normal.  Skin: Skin is warm and dry. There is erythema.  Circular area of erythema approximately 6 cm in diameter noted over the lateral right thigh. Some induration without fluctuance. No streaking. No overlying vesicles, pustules, bullae, superficial abscesses or desquamation. No skin breakdown noted. Area is tender to palpation. No warmth.   Psychiatric: She has a normal mood and affect. Her behavior is normal.  Nursing note and vitals reviewed.   ED Course  Procedures (including critical care time) Labs Review Labs Reviewed - No data to display  Imaging Review No results found. I have personally reviewed  and evaluated these images and lab results as part of my medical decision-making.   EKG Interpretation None      MDM   Final diagnoses:  Cellulitis of right lower extremity   Pt presenting with skin rash consistent with cellulitis. Localized area of erythema without streaking, papules, vesicles or desquamation. Pt is without gross abscess for which I&D would be possible.  Pt encouraged to return if redness begins to streak, fever or nausea/vomiting develop. Will discharge on doxycycline and instruction to follow up with her PCP to ensure area is improving. Pt is alert, oriented, NAD, afebrile, non  tachycardic, nonseptic and nontoxic appearing. Pt to be d/c on oral antibiotics with strict f/u instructions.       Rolm Gala Jonerik Sliker, PA-C 07/05/15 2110  Pricilla Loveless, MD 07/07/15 [phone]

## 2016-01-15 ENCOUNTER — Emergency Department (HOSPITAL_BASED_OUTPATIENT_CLINIC_OR_DEPARTMENT_OTHER)
Admission: EM | Admit: 2016-01-15 | Discharge: 2016-01-15 | Disposition: A | Discharge status: Home / Self Care | Payer: Managed Care, Other (non HMO) | Attending: Physician Assistant | Admitting: Physician Assistant

## 2016-01-15 ENCOUNTER — Encounter (HOSPITAL_BASED_OUTPATIENT_CLINIC_OR_DEPARTMENT_OTHER): Payer: Self-pay | Admitting: Emergency Medicine

## 2016-01-15 ENCOUNTER — Emergency Department (HOSPITAL_BASED_OUTPATIENT_CLINIC_OR_DEPARTMENT_OTHER): Payer: Managed Care, Other (non HMO)

## 2016-01-15 DIAGNOSIS — F17210 Nicotine dependence, cigarettes, uncomplicated: Secondary | ICD-10-CM | POA: Diagnosis not present

## 2016-01-15 DIAGNOSIS — R05 Cough: Secondary | ICD-10-CM | POA: Diagnosis present

## 2016-01-15 DIAGNOSIS — J40 Bronchitis, not specified as acute or chronic: Secondary | ICD-10-CM | POA: Insufficient documentation

## 2016-01-15 DIAGNOSIS — F319 Bipolar disorder, unspecified: Secondary | ICD-10-CM | POA: Diagnosis not present

## 2016-01-15 MED ORDER — BENZONATATE 100 MG PO CAPS: 100.0000 mg | ORAL_CAPSULE | Freq: Three times a day (TID) | ORAL | Status: DC

## 2016-01-15 MED ORDER — GUAIFENESIN 100 MG/5ML PO LIQD: 100.0000 mg | ORAL | Status: DC | PRN

## 2016-01-15 MED ORDER — BENZONATATE 100 MG PO CAPS
100.0000 mg | ORAL_CAPSULE | Freq: Once | ORAL | Status: AC
  Administered 2016-01-15: 100 mg via ORAL
  Filled 2016-01-15: qty 1

## 2016-01-15 MED ORDER — ALBUTEROL SULFATE HFA 108 (90 BASE) MCG/ACT IN AERS
2.0000 | INHALATION_SPRAY | RESPIRATORY_TRACT | Status: DC | PRN
  Administered 2016-01-15: 2 via RESPIRATORY_TRACT
  Filled 2016-01-15: qty 6.7

## 2016-01-15 MED FILL — ROBAFEN 100 MG/5 ML SYRUP: 100 | 3 days supply | Qty: 118 | Fill #0

## 2016-01-15 MED FILL — BENZONATATE 100 MG CAPSULE: 100 | 7 days supply | Qty: 21 | Fill #0

## 2016-01-15 NOTE — Discharge Instructions (Signed)
Use albuterol inhaler 2 puffs every 4 hours as needed for persistent cough, wheeze or shortness of breath.  Take tessalon and guaifenesin as needed for your symptoms.    Acute Bronchitis Bronchitis is inflammation of the airways that extend from the windpipe into the lungs (bronchi). The inflammation often causes mucus to develop. This leads to a cough, which is the most common symptom of bronchitis.  In acute bronchitis, the condition usually develops suddenly and goes away over time, usually in a couple weeks. Smoking, allergies, and asthma can make bronchitis worse. Repeated episodes of bronchitis may cause further lung problems.  CAUSES Acute bronchitis is most often caused by the same virus that causes a cold. The virus can spread from person to person (contagious) through coughing, sneezing, and touching contaminated objects. SIGNS AND SYMPTOMS   Cough.   Fever.   Coughing up mucus.   Body aches.   Chest congestion.   Chills.   Shortness of breath.   Sore throat.  DIAGNOSIS  Acute bronchitis is usually diagnosed through a physical exam. Your health care provider will also ask you questions about your medical history. Tests, such as chest X-rays, are sometimes done to rule out other conditions.  TREATMENT  Acute bronchitis usually goes away in a couple weeks. Oftentimes, no medical treatment is necessary. Medicines are sometimes given for relief of fever or cough. Antibiotic medicines are usually not needed but may be prescribed in certain situations. In some cases, an inhaler may be recommended to help reduce shortness of breath and control the cough. A cool mist vaporizer may also be used to help thin bronchial secretions and make it easier to clear the chest.  HOME CARE INSTRUCTIONS  Get plenty of rest.   Drink enough fluids to keep your urine clear or pale yellow (unless you have a medical condition that requires fluid restriction). Increasing fluids may help thin  your respiratory secretions (sputum) and reduce chest congestion, and it will prevent dehydration.   Take medicines only as directed by your health care provider.  If you were prescribed an antibiotic medicine, finish it all even if you start to feel better.  Avoid smoking and secondhand smoke. Exposure to cigarette smoke or irritating chemicals will make bronchitis worse. If you are a smoker, consider using nicotine gum or skin patches to help control withdrawal symptoms. Quitting smoking will help your lungs heal faster.   Reduce the chances of another bout of acute bronchitis by washing your hands frequently, avoiding people with cold symptoms, and trying not to touch your hands to your mouth, nose, or eyes.   Keep all follow-up visits as directed by your health care provider.  SEEK MEDICAL CARE IF: Your symptoms do not improve after 1 week of treatment.  SEEK IMMEDIATE MEDICAL CARE IF:  You develop an increased fever or chills.   You have chest pain.   You have severe shortness of breath.  You have bloody sputum.   You develop dehydration.  You faint or repeatedly feel like you are going to pass out.  You develop repeated vomiting.  You develop a severe headache. MAKE SURE YOU:   Understand these instructions.  Will watch your condition.  Will get help right away if you are not doing well or get worse.   This information is not intended to replace advice given to you by your health care provider. Make sure you discuss any questions you have with your health care provider.   Document Released: 07/28/2004 Document  Revised: 07/11/2014 Document Reviewed: 12/11/2012 Elsevier Interactive Patient Education [employer].

## 2016-01-15 NOTE — ED Notes (Signed)
Pt c/o cough and chest congestion x 2 days.  Ambulates to room NAD noted

## 2016-01-15 NOTE — ED Provider Notes (Signed)
CSN: [id number]     Arrival date & time 01/15/16  4098 History   First MD Initiated Contact with Patient 01/15/16 1005     Chief Complaint  Patient presents with  . Chest Congestion and Cough      (Consider location/radiation/quality/duration/timing/severity/associated sxs/prior Treatment) HPI   30 year old female with history of bipolar, presenting with cold symptoms. Patient reports for the past 4 days she has had nausea and vomiting. Vomitus is nonbloody nonbilious in her vomits seems to be improving however for the past 2 days she has had persistent cough and congestion. Cough is productive with yellow sputum along with posttussive emesis. She is losing her voice, and reported having shortness of breath when she coughs. She denies having fever, headache, neck stiffness, abdominal pain, back pain or rash. She is a smoker. She denies any recent sick contact. No other management changes. No prior history of PE or DVT, no recent surgery, prolonged bed rest, unilateral swelling or calf pain or active cancer.  Past Medical History  Diagnosis Date  . Bipolar 1 disorder [facility])    Past Surgical History  Procedure Laterality Date  . Tubal ligation     Family History  Problem Relation Age of Onset  . Cancer Mother     cervical  . Cancer Maternal Grandmother     uterine  . Hypertension Maternal Grandfather   . Diabetes Maternal Grandfather    Social History  Substance Use Topics  . Smoking status: Current Every Day Smoker -- 0.50 packs/day    Types: Cigarettes  . Smokeless tobacco: None  . Alcohol Use: Yes     Comment: occ   OB History    Gravida Para Term Preterm AB TAB SAB Ectopic Multiple Living   Review of Systems  All other systems reviewed and are negative.     Allergies  Keflex and Penicillins cross reactors  Home Medications   Prior to Admission medications   Not on File   BP 122/87 mmHg  Pulse 90  Temp(Src) 99.2 F (37.3 C) (Oral)   Resp 20  Ht  (1.626 m)  Wt 81.647 kg  BMI 30.88 kg/m2  SpO2 100%  LMP 01/15/2016 Physical Exam  Constitutional: She appears well-developed and well-nourished. No distress.  Well appearing African-American female laying in bed, with persistent cough and holding her emesis bag  HENT:  Head: Atraumatic.  Right Ear: External ear normal.  Left Ear: External ear normal.  Nose: Nose normal.  Mouth/Throat: Oropharynx is clear and moist.  Hoarsed voice  Eyes: Conjunctivae are normal.  Neck: Neck supple.  No nuchal rigidity  Cardiovascular: Normal rate and regular rhythm.   Pulmonary/Chest: Effort normal and breath sounds normal.  Scattered rhonchi heard, cleared with cough. No wheezes or rales  Abdominal: Soft. There is no tenderness.  Neurological: She is alert.  Skin: No rash noted.  Psychiatric: She has a normal mood and affect.  Nursing note and vitals reviewed.   ED Course  Procedures (including critical care time) Labs Review Labs Reviewed - No data to display  Imaging Review Dg Chest 2 View  01/15/2016  CLINICAL DATA:  Cough, congestion for a few days EXAM: CHEST  2 VIEW COMPARISON:  None. FINDINGS: The heart size and mediastinal contours are within normal limits. Both lungs are clear. The visualized skeletal structures are unremarkable. IMPRESSION: No active cardiopulmonary disease. Electronically Signed   By: Charlett Nose M.D.  On: 01/15/2016 10:36   I have personally reviewed and evaluated these images and lab results as part of my medical decision-making.   EKG Interpretation None      MDM   Final diagnoses:  Bronchitis    BP 122/87 mmHg  Pulse 90  Temp(Src) 99.2 F (37.3 C) (Oral)  Resp 20  Ht 5\' 4"  (1.626 m)  Wt 81.647 kg  BMI 30.88 kg/m2  SpO2 100%  LMP 01/15/2016   10:26 AM Patient presents with persistent productive cough, rhonchi, suggestive of bronchitis. Chest x-ray ordered to rule out pneumonia. Cough medication provided.  11:14  AM CXR unremarkable. Will treat sxs with tessalon, guaifenesin as well as albuterol inhaler.    Fayrene HelperBowie Javione Gunawan, PA-C 01/15/16 1115  Courteney Randall AnLyn Mackuen, MD 01/17/16 2045

## 2016-06-01 ENCOUNTER — Encounter (HOSPITAL_BASED_OUTPATIENT_CLINIC_OR_DEPARTMENT_OTHER): Payer: Self-pay | Admitting: *Deleted

## 2016-06-01 ENCOUNTER — Emergency Department (HOSPITAL_BASED_OUTPATIENT_CLINIC_OR_DEPARTMENT_OTHER)
Admission: EM | Admit: 2016-06-01 | Discharge: 2016-06-01 | Disposition: A | Discharge status: Home / Self Care | Payer: Managed Care, Other (non HMO) | Attending: Emergency Medicine | Admitting: Emergency Medicine

## 2016-06-01 ENCOUNTER — Emergency Department (HOSPITAL_BASED_OUTPATIENT_CLINIC_OR_DEPARTMENT_OTHER): Payer: Managed Care, Other (non HMO)

## 2016-06-01 DIAGNOSIS — J069 Acute upper respiratory infection, unspecified: Secondary | ICD-10-CM | POA: Insufficient documentation

## 2016-06-01 DIAGNOSIS — R05 Cough: Secondary | ICD-10-CM | POA: Diagnosis present

## 2016-06-01 DIAGNOSIS — F17210 Nicotine dependence, cigarettes, uncomplicated: Secondary | ICD-10-CM | POA: Diagnosis not present

## 2016-06-01 DIAGNOSIS — R059 Cough, unspecified: Secondary | ICD-10-CM

## 2016-06-01 DIAGNOSIS — B9789 Other viral agents as the cause of diseases classified elsewhere: Secondary | ICD-10-CM

## 2016-06-01 MED ORDER — ALBUTEROL SULFATE HFA 108 (90 BASE) MCG/ACT IN AERS: 2.0000 | INHALATION_SPRAY | RESPIRATORY_TRACT | 1 refills | Status: DC | PRN

## 2016-06-01 MED ORDER — ALBUTEROL SULFATE (2.5 MG/3ML) 0.083% IN NEBU
5.0000 mg | INHALATION_SOLUTION | Freq: Once | RESPIRATORY_TRACT | Status: AC
  Administered 2016-06-01: 5 mg via RESPIRATORY_TRACT
  Filled 2016-06-01: qty 6

## 2016-06-01 MED ORDER — IPRATROPIUM BROMIDE 0.02 % IN SOLN
0.5000 mg | Freq: Once | RESPIRATORY_TRACT | Status: AC
  Administered 2016-06-01: 0.5 mg via RESPIRATORY_TRACT
  Filled 2016-06-01: qty 2.5

## 2016-06-01 NOTE — ED Triage Notes (Signed)
Cough and congestion since Thursday. Fever over the weekend. Continued cough and c/o soreness in shoulders, chest and back with cough. States sx are improving but she needs to be seen before she can return to work

## 2016-06-01 NOTE — ED Provider Notes (Signed)
[facility]   CSN: [id number] Arrival date & time: 06/01/16  0745     History   Chief Complaint Chief Complaint  Patient presents with  . Cough    HPI Alyssa Brooks is a 30 y.o. female.  Patient c/o onset cough last Thursday, episodic, persistent. Cough mainly non prod, occ small amt sputum. Indicates in past 2 days cough improved from prior. Subjective fever. No sore throat. Mild nasal congestion. No headache or neck pain. Hx wheezing w uri symptoms, but denies hx asthma.  +smoker. No known ill contacts. Eating/drinking fine, no nvd.    The history is provided by the patient.  Cough  Pertinent negatives include no chest pain, no headaches, no sore throat, no shortness of breath and no eye redness.    Past Medical History:  Diagnosis Date  . Bipolar 1 disorder (HCC)     There are no active problems to display for this patient.   Past Surgical History:  Procedure Laterality Date  . BREAST CYST EXCISION    . TUBAL LIGATION      OB History    Gravida Para Term Preterm AB Living   7 3 3   4 3    SAB TAB Ectopic Multiple Live Births   2 2             Home Medications    Prior to Admission medications   Medication Sig Start Date End Date Taking? Authorizing Provider  benzonatate (TESSALON) 100 MG capsule Take 1 capsule (100 mg total) by mouth every 8 (eight) hours. 01/15/16   Fayrene HelperBowie Tran, PA-C  guaiFENesin (ROBITUSSIN) 100 MG/5ML liquid Take 5-10 mLs (100-200 mg total) by mouth every 4 (four) hours as needed for cough. 01/15/16   Fayrene HelperBowie Tran, PA-C    Family History Family History  Problem Relation Age of Onset  . Cancer Mother     cervical  . Cancer Maternal Grandmother     uterine  . Hypertension Maternal Grandfather   . Diabetes Maternal Grandfather     Social History Social History  Substance Use Topics  . Smoking status: Current Every Day Smoker    Packs/day: 0.50    Types: Cigarettes  . Smokeless tobacco: Never Used  .  Alcohol use Yes     Comment: rare     Allergies   Keflex [cephalexin monohydrate] and Penicillins cross reactors   Review of Systems Review of Systems  Constitutional: Positive for fever.  HENT: Negative for sore throat.   Eyes: Negative for discharge and redness.  Respiratory: Positive for cough. Negative for shortness of breath.   Cardiovascular: Negative for chest pain.  Gastrointestinal: Negative for abdominal pain.  Genitourinary: Negative for flank pain.  Musculoskeletal: Negative for back pain and neck pain.  Skin: Negative for rash.  Neurological: Negative for headaches.  Hematological: Does not bruise/bleed easily.  Psychiatric/Behavioral: Negative for confusion.     Physical Exam Updated Vital Signs BP 139/99 (BP Location: Right Arm)   Pulse 81   Temp 98.5 F (36.9 C) (Oral)   Resp 22   Ht 5\' 4"  (1.626 m)   Wt 81.6 kg   LMP 05/27/2016   SpO2 99%   BMI 30.90 kg/m   Physical Exam  Constitutional: She appears well-developed and well-nourished. No distress.  HENT:  Nose: Nose normal.  Mouth/Throat: Oropharynx is clear and moist.  tms normal.   Eyes: Conjunctivae are normal. Right eye exhibits no discharge. Left eye exhibits no discharge. No scleral icterus.  Neck: Neck supple. No tracheal deviation present.  No stiffness or rigidity  Cardiovascular: Normal rate.   Pulmonary/Chest: Effort normal. No respiratory distress. She has wheezes.  Abdominal: Normal appearance. She exhibits no distension.  Musculoskeletal: She exhibits no edema.  Lymphadenopathy:    She has no cervical adenopathy.  Neurological: She is alert.  Skin: Skin is warm and dry. No rash noted.  Psychiatric: She has a normal mood and affect.  Nursing note and vitals reviewed.    ED Treatments / Results  Labs (all labs ordered are listed, but only abnormal results are displayed) Labs Reviewed - No data to display  EKG  EKG Interpretation None       Radiology Dg Chest 2  View  Result Date: 06/01/2016 CLINICAL DATA:  Cough and congestion for 1 week.  Smoking history. EXAM: CHEST  2 VIEW COMPARISON:  01/15/2016 FINDINGS: The heart size and mediastinal contours are within normal limits. Both lungs are clear. The visualized skeletal structures are unremarkable. IMPRESSION: No active cardiopulmonary disease. Electronically Signed   By: Richarda OverlieAdam  Henn M.D.   On: 06/01/2016 08:50    Procedures Procedures (including critical care time)  Medications Ordered in ED Medications - No data to display   Initial Impression / Assessment and Plan / ED Course  I have reviewed the triage vital signs and the nursing notes.  Pertinent labs & imaging results that were available during my care of the patient were reviewed by me and considered in my medical decision making (see chart for details).  Clinical Course    Albuterol neb tx.   Cxr.   Reviewed nursing notes and prior charts for additional history.   Recheck, no increased wob or wheezing. cxr neg.   Final Clinical Impressions(s) / ED Diagnoses   Final diagnoses:  None    New Prescriptions New Prescriptions   No medications on file     Cathren LaineKevin Loys Hoselton, MD 06/01/16 [id number]

## 2016-06-01 NOTE — Discharge Instructions (Addendum)
It was our pleasure to provide your ER care today - we hope that you feel better.  Use albuterol inhaler as need if wheezing.  Avoid any smoking.  Follow up with primary care doctor in 1 week if symptoms fail to improve/resolve.  Return to ER if worse, new symptoms, trouble breathing, other concern.

## 2016-08-04 ENCOUNTER — Emergency Department (HOSPITAL_BASED_OUTPATIENT_CLINIC_OR_DEPARTMENT_OTHER)
Admission: EM | Admit: 2016-08-04 | Discharge: 2016-08-04 | Disposition: A | Discharge status: Home / Self Care | Payer: BLUE CROSS/BLUE SHIELD | Attending: Emergency Medicine | Admitting: Emergency Medicine

## 2016-08-04 ENCOUNTER — Encounter (HOSPITAL_BASED_OUTPATIENT_CLINIC_OR_DEPARTMENT_OTHER): Payer: Self-pay

## 2016-08-04 DIAGNOSIS — R509 Fever, unspecified: Secondary | ICD-10-CM | POA: Insufficient documentation

## 2016-08-04 DIAGNOSIS — R05 Cough: Secondary | ICD-10-CM | POA: Diagnosis present

## 2016-08-04 DIAGNOSIS — R0989 Other specified symptoms and signs involving the circulatory and respiratory systems: Secondary | ICD-10-CM | POA: Insufficient documentation

## 2016-08-04 DIAGNOSIS — F17210 Nicotine dependence, cigarettes, uncomplicated: Secondary | ICD-10-CM | POA: Diagnosis not present

## 2016-08-04 DIAGNOSIS — R69 Illness, unspecified: Secondary | ICD-10-CM

## 2016-08-04 DIAGNOSIS — R52 Pain, unspecified: Secondary | ICD-10-CM | POA: Insufficient documentation

## 2016-08-04 DIAGNOSIS — J111 Influenza due to unidentified influenza virus with other respiratory manifestations: Secondary | ICD-10-CM

## 2016-08-04 MED ORDER — IBUPROFEN 400 MG PO TABS
ORAL_TABLET | ORAL | Status: AC
  Filled 2016-08-04: qty 1

## 2016-08-04 MED ORDER — IBUPROFEN 400 MG PO TABS
600.0000 mg | ORAL_TABLET | Freq: Once | ORAL | Status: AC
  Administered 2016-08-04: 600 mg via ORAL

## 2016-08-04 MED ORDER — IBUPROFEN 200 MG PO TABS
ORAL_TABLET | ORAL | Status: AC
  Filled 2016-08-04: qty 1

## 2016-08-04 NOTE — ED Triage Notes (Signed)
C/o body aches, prod cough x 2 days-NAD-steady gait

## 2016-08-04 NOTE — Discharge Instructions (Signed)
Ibuprofen 600 mg every 6 hours as needed for pain or fever.  Drink plenty of fluids and get plenty of rest.  Over-the-counter medications as needed for symptom relief.  Return to the emergency department if you develop severe chest pain, difficulty breathing, or other new and concerning symptoms.

## 2016-08-04 NOTE — ED Provider Notes (Signed)
[facility] Provider Note   CSN: [id number] Arrival date & time: 08/04/16  1132     History   Chief Complaint Chief Complaint  Patient presents with  . Generalized Body Aches    HPI Alyssa Brooks is a 31 y.o. female.  Patient is a 31 year old female with history of bipolar disorder. She presents for evaluation of chest congestion, cough, body aches, low-grade fever, and chills for the past 2 days. She reports a work contact has been diagnosed with the flu. She denies any severe chest pain or shortness of breath. He has tried over-the-counter medications with little relief.   The history is provided by the patient.    Past Medical History:  Diagnosis Date  . Bipolar 1 disorder (HCC)     There are no active problems to display for this patient.   Past Surgical History:  Procedure Laterality Date  . BREAST CYST EXCISION    . TUBAL LIGATION      OB History    Gravida Para Term Preterm AB Living   7 3 3   4 3    SAB TAB Ectopic Multiple Live Births   2 2             Home Medications    Prior to Admission medications   Not on File    Family History Family History  Problem Relation Age of Onset  . Cancer Mother     cervical  . Cancer Maternal Grandmother     uterine  . Hypertension Maternal Grandfather   . Diabetes Maternal Grandfather     Social History Social History  Substance Use Topics  . Smoking status: Current Every Day Smoker    Packs/day: 0.50    Types: Cigarettes  . Smokeless tobacco: Never Used  . Alcohol use Yes     Comment: rare     Allergies   Keflex [cephalexin monohydrate] and Penicillins cross reactors   Review of Systems Review of Systems  All other systems reviewed and are negative.    Physical Exam Updated Vital Signs BP 131/98 (BP Location: Left Arm)   Pulse 112   Temp 99.4 F (37.4 C) (Oral)   Resp 26   Ht 5\' 4"  (1.626 m)   Wt 178 lb 9.6 oz (81 kg)   LMP 07/21/2016   SpO2 100%   BMI 30.66 kg/m    Physical Exam  Constitutional: She is oriented to person, place, and time. She appears well-developed and well-nourished. No distress.  HENT:  Head: Normocephalic and atraumatic.  Mouth/Throat: Oropharynx is clear and moist.  TMs are clear bilaterally.  Neck: Normal range of motion. Neck supple.  Cardiovascular: Normal rate and regular rhythm.  Exam reveals no gallop and no friction rub.   No murmur heard. Pulmonary/Chest: Effort normal and breath sounds normal. No respiratory distress. She has no wheezes.  Abdominal: Soft. Bowel sounds are normal. She exhibits no distension. There is no tenderness.  Musculoskeletal: Normal range of motion.  Lymphadenopathy:    She has no cervical adenopathy.  Neurological: She is alert and oriented to person, place, and time.  Skin: Skin is warm and dry. She is not diaphoretic.  Nursing note and vitals reviewed.    ED Treatments / Results  Labs (all labs ordered are listed, but only abnormal results are displayed) Labs Reviewed - No data to display  EKG  EKG Interpretation None       Radiology No results found.  Procedures Procedures (including critical care time)  Medications Ordered in ED Medications - No data to display   Initial Impression / Assessment and Plan / ED Course  I have reviewed the triage vital signs and the nursing notes.  Pertinent labs & imaging results that were available during my care of the patient were reviewed by me and considered in my medical decision making (see chart for details).  Patient presents with URI symptoms that are most likely viral in nature, possibly influenza. She will be treated with continued over-the-counter medications, plenty of fluids, and when necessary return. Her lungs are clear and oxygen saturations are 100%.  Final Clinical Impressions(s) / ED Diagnoses   Final diagnoses:  None    New Prescriptions New Prescriptions   No medications on file     Geoffery Lyons,  MD 08/04/16 1204

## 2017-08-10 ENCOUNTER — Emergency Department (HOSPITAL_BASED_OUTPATIENT_CLINIC_OR_DEPARTMENT_OTHER)
Admission: EM | Admit: 2017-08-10 | Discharge: 2017-08-10 | Disposition: A | Discharge status: Home / Self Care | Payer: BLUE CROSS/BLUE SHIELD | Attending: Emergency Medicine | Admitting: Emergency Medicine

## 2017-08-10 ENCOUNTER — Encounter (HOSPITAL_BASED_OUTPATIENT_CLINIC_OR_DEPARTMENT_OTHER): Payer: Self-pay | Admitting: *Deleted

## 2017-08-10 ENCOUNTER — Other Ambulatory Visit: Payer: Self-pay

## 2017-08-10 DIAGNOSIS — F17210 Nicotine dependence, cigarettes, uncomplicated: Secondary | ICD-10-CM | POA: Insufficient documentation

## 2017-08-10 DIAGNOSIS — R112 Nausea with vomiting, unspecified: Secondary | ICD-10-CM

## 2017-08-10 DIAGNOSIS — R5383 Other fatigue: Secondary | ICD-10-CM | POA: Insufficient documentation

## 2017-08-10 DIAGNOSIS — R110 Nausea: Secondary | ICD-10-CM | POA: Insufficient documentation

## 2017-08-10 MED ORDER — PROMETHAZINE HCL 25 MG RE SUPP: 25.0000 mg | Freq: Four times a day (QID) | RECTAL | 0 refills | Status: DC | PRN

## 2017-08-10 NOTE — Discharge Instructions (Signed)
You have been seen in the Emergency Department (ED) today for nausea and vomiting.  Your work up today has not shown a clear cause for your symptoms. You have been prescribed Phenergan; please use as prescribed as needed for your nausea.  Follow up with your doctor as soon as possible regarding today's emergent visit and your symptoms of nausea.   Return to the ED if you develop abdominal, bloody vomiting, bloody diarrhea, if you are unable to tolerate fluids due to vomiting, or if you develop other symptoms that concern you.  

## 2017-08-10 NOTE — ED Triage Notes (Signed)
Pt reports cough, congestion, body aches, chills x Sunday, states she feels much better today but her work will not allow her to return without a note from a doctor. Denies fevers, body aches or chills at this time, states "I just still have a little cough, but nothing like it was."

## 2017-08-10 NOTE — ED Provider Notes (Signed)
Emergency Department Provider Note   I have reviewed the triage vital signs and the nursing notes.   HISTORY  Chief Complaint Cough   HPI Alyssa Brooks is a 32 y.o. female with PMH of Bipolar disorder to the emergency department for evaluation of vomiting with generalized fatigue.  Symptoms began approximately 5 days ago and have significantly improved since yesterday and she was given a friend's prescription for Phenergan suppository.  She took this medication once and is since been nausea and vomiting free.  She has been eating and drinking without difficulty.  No fevers.  Denies abdominal pain.  No chest pain or shortness of breath.  No productive cough.  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   Past Medical History:  Diagnosis Date  . Bipolar 1 disorder (HCC)     There are no active problems to display for this patient.   Past Surgical History:  Procedure Laterality Date  . BREAST CYST EXCISION    . TUBAL LIGATION        Allergies Keflex [cephalexin monohydrate] and Penicillins cross reactors  Family History  Problem Relation Age of Onset  . Cancer Mother        cervical  . Cancer Maternal Grandmother        uterine  . Hypertension Maternal Grandfather   . Diabetes Maternal Grandfather     Social History Social History   Tobacco Use  . Smoking status: Current Every Day Smoker    Packs/day: 0.50    Types: Cigarettes  . Smokeless tobacco: Never Used  Substance Use Topics  . Alcohol use: Yes    Comment: rare  . Drug use: Yes    Frequency: 7.0 times per week    Types: Marijuana    Review of Systems  Constitutional: No fever/chills Eyes: No visual changes. ENT: No sore throat. Cardiovascular: Denies chest pain. Respiratory: Denies shortness of breath. Gastrointestinal: No abdominal pain.  No nausea, no vomiting.  No diarrhea.  No constipation. Genitourinary:  Negative for dysuria. Musculoskeletal: Negative for back pain. Skin: Negative for rash. Neurological: Negative for headaches, focal weakness or numbness.  10-point ROS otherwise negative.  ____________________________________________   PHYSICAL EXAM:  VITAL SIGNS: ED Triage Vitals  Enc Vitals Group     BP 08/10/17 0844 (!) 129/91     Pulse Rate 08/10/17 0844 94     Resp 08/10/17 0844 20     Temp 08/10/17 0844 98.6 F (37 C)     Temp Source 08/10/17 0844 Oral     SpO2 08/10/17 0844 99 %     Weight 08/10/17 0847 185 lb (83.9 kg)     Height 08/10/17 0847 5\' 4"  (1.626 m)   Constitutional: Alert and oriented. Well appearing and in no acute distress. Eyes: Conjunctivae are normal.  Head: Atraumatic. Nose: No congestion/rhinnorhea. Mouth/Throat: Mucous membranes are moist.  Neck: No stridor.   Cardiovascular: Normal rate, regular rhythm. Good peripheral circulation. Grossly normal heart sounds.   Respiratory: Normal respiratory effort.  No retractions. Lungs CTAB. Gastrointestinal: Soft and nontender. No distention.  Musculoskeletal: No lower extremity tenderness nor edema. No gross deformities of extremities. Neurologic:  Normal speech and language. No gross focal neurologic deficits are appreciated.  Skin:  Skin is warm, dry and intact. No rash noted.  ____________________________________________   PROCEDURES  Procedure(s) performed:   Procedures  None ____________________________________________   INITIAL  IMPRESSION / ASSESSMENT AND PLAN / ED COURSE  Pertinent labs & imaging results that were available during my care of the patient were reviewed by me and considered in my medical decision making (see chart for details).  Presents to the emergency department for evaluation of nausea and vomiting with generalized weakness over the past 5 days.  Symptoms have resolved.  Will provide prescription for Phenergan suppository to have as needed if symptoms return.   Patient's abdomen is diffusely soft and nontender to palpation.  No indication for labs or further imaging his symptoms seem to have drastically improved since yesterday.  Provided a note saying the patient could return to work tomorrow if she remains without vomiting or diarrhea for 24-hour period.   At this time, I do not feel there is any life-threatening condition present. I have reviewed and discussed all results (EKG, imaging, lab, urine as appropriate), exam findings with patient. I have reviewed nursing notes and appropriate previous records.  I feel the patient is safe to be discharged home without further emergent workup. Discussed usual and customary return precautions. Patient and family (if present) verbalize understanding and are comfortable with this plan.  Patient will follow-up with their primary care provider. If they do not have a primary care provider, information for follow-up has been provided to them. All questions have been answered.  ____________________________________________  FINAL CLINICAL IMPRESSION(S) / ED DIAGNOSES  Final diagnoses:  Non-intractable vomiting with nausea, unspecified vomiting type    NEW OUTPATIENT MEDICATIONS STARTED DURING THIS VISIT:  New Prescriptions   PROMETHAZINE (PHENERGAN) 25 MG SUPPOSITORY    Place 1 suppository (25 mg total) rectally every 6 (six) hours as needed for nausea or vomiting.    Note:  This document was prepared using Dragon voice recognition software and may include unintentional dictation errors.  Alona BeneJoshua Gerda Yin, MD Emergency Medicine    Mylik Pro, Arlyss RepressJoshua G, MD 08/10/17 0900

## 2018-09-05 ENCOUNTER — Other Ambulatory Visit: Payer: Self-pay

## 2018-09-05 ENCOUNTER — Emergency Department (HOSPITAL_BASED_OUTPATIENT_CLINIC_OR_DEPARTMENT_OTHER): Payer: Self-pay

## 2018-09-05 ENCOUNTER — Emergency Department (HOSPITAL_BASED_OUTPATIENT_CLINIC_OR_DEPARTMENT_OTHER)
Admission: EM | Admit: 2018-09-05 | Discharge: 2018-09-05 | Disposition: A | Discharge status: Home / Self Care | Payer: Self-pay | Attending: Emergency Medicine | Admitting: Emergency Medicine

## 2018-09-05 ENCOUNTER — Encounter (HOSPITAL_BASED_OUTPATIENT_CLINIC_OR_DEPARTMENT_OTHER): Payer: Self-pay | Admitting: Emergency Medicine

## 2018-09-05 DIAGNOSIS — I671 Cerebral aneurysm, nonruptured: Secondary | ICD-10-CM | POA: Insufficient documentation

## 2018-09-05 DIAGNOSIS — M5412 Radiculopathy, cervical region: Secondary | ICD-10-CM | POA: Insufficient documentation

## 2018-09-05 DIAGNOSIS — R51 Headache: Secondary | ICD-10-CM | POA: Insufficient documentation

## 2018-09-05 DIAGNOSIS — F17210 Nicotine dependence, cigarettes, uncomplicated: Secondary | ICD-10-CM | POA: Insufficient documentation

## 2018-09-05 LAB — CBC WITH DIFFERENTIAL/PLATELET
ABS IMMATURE GRANULOCYTES: 0.01 10*3/uL (ref 0.00–0.07)
BASOS PCT: 1 %
Basophils Absolute: 0 10*3/uL (ref 0.0–0.1)
EOS ABS: 0.5 10*3/uL (ref 0.0–0.5)
Eosinophils Relative: 9 %
HCT: 33.3 % — ABNORMAL LOW (ref 36.0–46.0)
Hemoglobin: 10.4 g/dL — ABNORMAL LOW (ref 12.0–15.0)
Immature Granulocytes: 0 %
Lymphocytes Relative: 43 %
Lymphs Abs: 2.5 10*3/uL (ref 0.7–4.0)
MCH: 27 pg (ref 26.0–34.0)
MCHC: 31.2 g/dL (ref 30.0–36.0)
MCV: 86.5 fL (ref 80.0–100.0)
MONO ABS: 0.5 10*3/uL (ref 0.1–1.0)
Monocytes Relative: 9 %
NEUTROS ABS: 2.3 10*3/uL (ref 1.7–7.7)
Neutrophils Relative %: 38 %
PLATELETS: 296 10*3/uL (ref 150–400)
RBC: 3.85 MIL/uL — ABNORMAL LOW (ref 3.87–5.11)
RDW: 14.4 % (ref 11.5–15.5)
WBC: 5.9 10*3/uL (ref 4.0–10.5)
nRBC: 0 % (ref 0.0–0.2)

## 2018-09-05 LAB — BASIC METABOLIC PANEL
ANION GAP: 2 — AB (ref 5–15)
BUN: 11 mg/dL (ref 6–20)
CALCIUM: 8.5 mg/dL — AB (ref 8.9–10.3)
CO2: 23 mmol/L (ref 22–32)
Chloride: 109 mmol/L (ref 98–111)
Creatinine, Ser: 0.93 mg/dL (ref 0.44–1.00)
GFR calc Af Amer: >60 mL/min (ref >60)
Glucose, Bld: 93 mg/dL (ref 70–99)
POTASSIUM: 4.4 mmol/L (ref 3.5–5.1)
SODIUM: 134 mmol/L — AB (ref 135–145)

## 2018-09-05 LAB — CSF CELL COUNT WITH DIFFERENTIAL
RBC COUNT CSF: 2 /mm3 — AB
RBC Count, CSF: 4 /mm3 — ABNORMAL HIGH
TUBE #: 1
TUBE #: 4
WBC, CSF: 0 /mm3 (ref 0–5)
WBC, CSF: 0 /mm3 (ref 0–5)

## 2018-09-05 LAB — GLUCOSE, CSF: GLUCOSE CSF: 54 mg/dL (ref 40–70)

## 2018-09-05 LAB — PROTEIN, CSF: Total  Protein, CSF: 17 mg/dL (ref 15–45)

## 2018-09-05 MED ORDER — CYCLOBENZAPRINE HCL 10 MG PO TABS: 10.0000 mg | ORAL_TABLET | Freq: Two times a day (BID) | ORAL | 0 refills | Status: DC | PRN

## 2018-09-05 MED ORDER — IOPAMIDOL (ISOVUE-370) INJECTION 76%
100.0000 mL | Freq: Once | INTRAVENOUS | Status: AC | PRN
  Administered 2018-09-05: 100 mL via INTRAVENOUS

## 2018-09-05 MED ORDER — NAPROXEN 375 MG PO TABS: 375.0000 mg | ORAL_TABLET | Freq: Two times a day (BID) | ORAL | 0 refills | Status: DC

## 2018-09-05 MED ORDER — FENTANYL CITRATE (PF) 100 MCG/2ML IJ SOLN
50.0000 ug | Freq: Once | INTRAMUSCULAR | Status: AC
Start: 2018-09-05 — End: 2018-09-05
  Administered 2018-09-05: 50 ug via INTRAVENOUS
  Filled 2018-09-05: qty 2

## 2018-09-05 NOTE — ED Notes (Signed)
Pt sleeping at this time.

## 2018-09-05 NOTE — ED Provider Notes (Signed)
[facility] EMERGENCY DEPARTMENT Provider Note   CSN: [id number] Arrival date & time: 09/05/18  0847    History   Chief Complaint Chief Complaint  Patient presents with  . Neck Pain    HPI Alyssa Brooks is a 33 y.o. female.     Patient is a 33 year old female who presents with neck pain.  She states yesterday she had a severe headache that was all over her head.  She also had some blurry vision and numbness to the right side of face.  She states that that has gone away but she now has severe pain in her right neck that radiates down her right arm and she also has some pain in her right leg.  She has tingling in the fingers of her right hand.  She denies any numbness to the leg.  She denies any weakness to the arm or the leg.  No known fevers.  No history of similar symptoms in the past.  She has not taken anything at home for the pain.     Past Medical History:  Diagnosis Date  . Bipolar 1 disorder (HCC)     There are no active problems to display for this patient.   Past Surgical History:  Procedure Laterality Date  . BREAST CYST EXCISION    . TUBAL LIGATION       OB History    Gravida  7   Para  3   Term  3   Preterm      AB  4   Living  3     SAB  2   TAB  2   Ectopic      Multiple      Live Births               Home Medications    Prior to Admission medications   Medication Sig Start Date End Date Taking? Authorizing Provider  cyclobenzaprine (FLEXERIL) 10 MG tablet Take 1 tablet (10 mg total) by mouth 2 (two) times daily as needed for muscle spasms. 09/05/18   Rolan Bucco, MD  naproxen (NAPROSYN) 375 MG tablet Take 1 tablet (375 mg total) by mouth 2 (two) times daily. 09/05/18   Rolan Bucco, MD  promethazine (PHENERGAN) 25 MG suppository Place 1 suppository (25 mg total) rectally every 6 (six) hours as needed for nausea or vomiting. 08/10/17   Long, Arlyss Repress, MD    Family History Family History  Problem Relation Age of  Onset  . Cancer Mother        cervical  . Cancer Maternal Grandmother        uterine  . Hypertension Maternal Grandfather   . Diabetes Maternal Grandfather     Social History Social History   Tobacco Use  . Smoking status: Current Every Day Smoker    Packs/day: 0.50    Types: Cigarettes  . Smokeless tobacco: Never Used  Substance Use Topics  . Alcohol use: Yes    Comment: rare  . Drug use: Yes    Frequency: 7.0 times per week    Types: Marijuana     Allergies   Keflex [cephalexin monohydrate] and Penicillins cross reactors   Review of Systems Review of Systems  Constitutional: Negative for chills, diaphoresis, fatigue and fever.  HENT: Negative for congestion, rhinorrhea and sneezing.   Eyes: Negative.   Respiratory: Negative for cough, chest tightness and shortness of breath.   Cardiovascular: Negative for chest pain and leg swelling.  Gastrointestinal: Negative  for abdominal pain, blood in stool, diarrhea, nausea and vomiting.  Genitourinary: Negative for difficulty urinating, flank pain, frequency and hematuria.  Musculoskeletal: Positive for myalgias and neck pain. Negative for arthralgias and back pain.  Skin: Negative for rash.  Neurological: Positive for numbness and headaches. Negative for dizziness, speech difficulty and weakness.     Physical Exam Updated Vital Signs BP 126/83 (BP Location: Right Arm)   Pulse (!) 59   Temp 97.9 F (36.6 C) (Oral)   Resp 17   LMP 08/31/2018   SpO2 99%   Physical Exam Constitutional:      Appearance: She is well-developed.  HENT:     Head: Normocephalic and atraumatic.  Eyes:     Pupils: Pupils are equal, round, and reactive to light.  Neck:     Musculoskeletal: Normal range of motion and neck supple.  Cardiovascular:     Rate and Rhythm: Normal rate and regular rhythm.     Heart sounds: Normal heart sounds.  Pulmonary:     Effort: Pulmonary effort is normal. No respiratory distress.     Breath sounds:  Normal breath sounds. No wheezing or rales.  Chest:     Chest wall: No tenderness.  Abdominal:     General: Bowel sounds are normal.     Palpations: Abdomen is soft.     Tenderness: There is no abdominal tenderness. There is no guarding or rebound.  Musculoskeletal: Normal range of motion.     Comments: Positive tenderness to the right paraspinal area in the cervical spine area.  There is pain over the right trapezius muscle.  There is no pain on range of motion of the arm or the leg.  Radial pulses and pedal pulses are intact.  She has normal sensation in all extremities to light touch.  She has normal movement in all extremities.  She has a normal gait.  She is able to walk on her toes and her heels without difficulty.  Lymphadenopathy:     Cervical: No cervical adenopathy.  Skin:    General: Skin is warm and dry.     Findings: No rash.  Neurological:     General: No focal deficit present.     Mental Status: She is alert and oriented to person, place, and time.     Comments: Cranial nerves II through XII grossly intact Motor 5 out of 5 all extremities, sensation grossly intact light touch all extremities      ED Treatments / Results  Labs (all labs ordered are listed, but only abnormal results are displayed) Labs Reviewed  BASIC METABOLIC PANEL - Abnormal; Notable for the following components:      Result Value   Sodium 134 (*)    Calcium 8.5 (*)    Anion gap 2 (*)    All other components within normal limits  CBC WITH DIFFERENTIAL/PLATELET - Abnormal; Notable for the following components:   RBC 3.85 (*)    Hemoglobin 10.4 (*)    HCT 33.3 (*)    All other components within normal limits  CSF CELL COUNT WITH DIFFERENTIAL - Abnormal; Notable for the following components:   RBC Count, CSF 4 (*)    All other components within normal limits  CSF CELL COUNT WITH DIFFERENTIAL - Abnormal; Notable for the following components:   RBC Count, CSF 2 (*)    All other components within  normal limits  CSF CULTURE  GLUCOSE, CSF  PROTEIN, CSF    EKG None  Radiology Ct  Angio Head W Or Wo Contrast  Result Date: 09/05/2018 CLINICAL DATA:  Worst headache of life.  Blurred vision and nausea. EXAM: CT ANGIOGRAPHY HEAD AND NECK TECHNIQUE: Multidetector CT imaging of the head and neck was performed using the standard protocol during bolus administration of intravenous contrast. Multiplanar CT image reconstructions and MIPs were obtained to evaluate the vascular anatomy. Carotid stenosis measurements (when applicable) are obtained utilizing NASCET criteria, using the distal internal carotid diameter as the denominator. CONTRAST:  ISOVUE-370 IOPAMIDOL (ISOVUE-370) INJECTION 76% COMPARISON:  01/18/2016 FINDINGS: CT HEAD FINDINGS Brain: No evidence of acute infarction, hemorrhage, hydrocephalus, extra-axial collection or mass lesion/mass effect. Vascular: Negative for hyperdense vessel Skull: Negative Sinuses: Mucosal edema paranasal sinuses. Orbits: Normal orbit Review of the MIP images confirms the above findings CTA NECK FINDINGS Aortic arch: Standard branching. Imaged portion shows no evidence of aneurysm or dissection. No significant stenosis of the major arch vessel origins. Right carotid system: Normal right carotid system without stenosis or dissection Left carotid system: Normal left carotid system without stenosis or dissection Vertebral arteries: Both vertebral arteries widely patent without stenosis or dissection Skeleton: Negative Other neck: Enlarged thyroid.  10 mm left lower pole nodule. Upper chest: Negative Review of the MIP images confirms the above findings CTA HEAD FINDINGS Anterior circulation: Right cavernous carotid widely patent. 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 It is difficult to identify the neck of the aneurysm. Right anterior and middle cerebral arteries widely patent without additional aneurysm. Left cavernous carotid widely patent. Left  anterior and middle cerebral arteries widely patent without stenosis or aneurysm Posterior circulation:  Normal.  No stenosis or aneurysm. Venous sinuses: Patent Anatomic variants: None Delayed phase: Normal enhancement postcontrast infusion Review of the MIP images confirms the above findings IMPRESSION: 1. No acute intracranial abnormality. Negative for intracranial hemorrhage 2. 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 The neck is difficult to identify. 3. These results were called by telephone at the time of interpretation on 09/05/2018 at 10:56 am to Dr. Shawna Orleans Ashonte Angelucci , who verbally acknowledged these results. Electronically Signed   By: Marlan Palau M.D.   On: 09/05/2018 10:57   Ct Angio Neck W And/or Wo Contrast  Result Date: 09/05/2018 CLINICAL DATA:  Worst headache of life.  Blurred vision and nausea. EXAM: CT ANGIOGRAPHY HEAD AND NECK TECHNIQUE: Multidetector CT imaging of the head and neck was performed using the standard protocol during bolus administration of intravenous contrast. Multiplanar CT image reconstructions and MIPs were obtained to evaluate the vascular anatomy. Carotid stenosis measurements (when applicable) are obtained utilizing NASCET criteria, using the distal internal carotid diameter as the denominator. CONTRAST:  ISOVUE-370 IOPAMIDOL (ISOVUE-370) INJECTION 76% COMPARISON:  01/18/2016 FINDINGS: CT HEAD FINDINGS Brain: No evidence of acute infarction, hemorrhage, hydrocephalus, extra-axial collection or mass lesion/mass effect. Vascular: Negative for hyperdense vessel Skull: Negative Sinuses: Mucosal edema paranasal sinuses. Orbits: Normal orbit Review of the MIP images confirms the above findings CTA NECK FINDINGS Aortic arch: Standard branching. Imaged portion shows no evidence of aneurysm or dissection. No significant stenosis of the major arch vessel origins. Right carotid system: Normal right carotid system without stenosis or dissection Left carotid system: Normal  left carotid system without stenosis or dissection Vertebral arteries: Both vertebral arteries widely patent without stenosis or dissection Skeleton: Negative Other neck: Enlarged thyroid.  10 mm left lower pole nodule. Upper chest: Negative Review of the MIP images confirms the above findings CTA HEAD FINDINGS Anterior circulation:  Right cavernous carotid widely patent. 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 It is difficult to identify the neck of the aneurysm. Right anterior and middle cerebral arteries widely patent without additional aneurysm. Left cavernous carotid widely patent. Left anterior and middle cerebral arteries widely patent without stenosis or aneurysm Posterior circulation:  Normal.  No stenosis or aneurysm. Venous sinuses: Patent Anatomic variants: None Delayed phase: Normal enhancement postcontrast infusion Review of the MIP images confirms the above findings IMPRESSION: 1. No acute intracranial abnormality. Negative for intracranial hemorrhage 2. 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 The neck is difficult to identify. 3. These results were called by telephone at the time of interpretation on 09/05/2018 at 10:56 am to Dr. Shawna Orleans Kengo Sturges , who verbally acknowledged these results. Electronically Signed   By: Marlan Palau M.D.   On: 09/05/2018 10:57    Procedures .Lumbar Puncture Date/Time: 09/05/2018 12:12 PM Performed by: Rolan Bucco, MD Authorized by: Rolan Bucco, MD   Consent:    Consent obtained:  Written   Consent given by:  Patient   Risks discussed:  Bleeding, infection, nerve damage, headache and pain   Alternatives discussed:  No treatment Pre-procedure details:    Procedure purpose:  Diagnostic   Preparation: Patient was prepped and draped in usual sterile fashion   Anesthesia (see MAR for exact dosages):    Anesthesia method:  Local infiltration   Local anesthetic:  Lidocaine 1% w/o epi Procedure details:    Lumbar space:  L3-L4  interspace   Patient position:  Sitting   Needle gauge:  20   Needle type:  Diamond point   Needle length (in):  3.5   Number of attempts:  1   Fluid appearance:  Clear   Tubes of fluid:  4   Total volume (ml):  8 Post-procedure:    Puncture site:  Adhesive bandage applied and direct pressure applied   Patient tolerance of procedure:  Tolerated well, no immediate complications   (including critical care time)  Medications Ordered in ED Medications  iopamidol (ISOVUE-370) 76 % injection 100 mL (100 mLs Intravenous Contrast Given 09/05/18 1010)  fentaNYL (SUBLIMAZE) injection 50 mcg (50 mcg Intravenous Given 09/05/18 1219)     Initial Impression / Assessment and Plan / ED Course  I have reviewed the triage vital signs and the nursing notes.  Pertinent labs & imaging results that were available during my care of the patient were reviewed by me and considered in my medical decision making (see chart for details).       Patient patient is a 33 year old female who presents with right-sided neck pain that seems to be radicular in nature.  She has pain going down her arm and some discomfort in her right leg.  There is some tingling in her right hand but no other neurologic deficits.  She did report previously some vision changes associated with a severe headache that happened yesterday.  Given these symptoms, CTA a of the head and neck were performed.  There is noted a 7 to 8 mm cerebral aneurysm in the peri-ophthalmic artery.  I spoke to Dr. Wynetta Emery with neurosurgery who recommended doing an LP to see whether this potentially was a cause of her symptoms versus an incidental finding.  The LP is not consistent with a ruptured aneurysm.  Patient denies any ongoing headaches.  She does have right-sided neck pain with radicular symptoms which I feel is more of a cervical issue.  She  does not have any symptoms that would more concerning for stroke.  Dr. Wynetta Emery advised to refer her to Dr. Conchita Paris with  neurosurgery.  I advised the patient to call tomorrow to establish an appointment.  Return precautions were given.  Final Clinical Impressions(s) / ED Diagnoses   Final diagnoses:  Cervical radicular pain  Cerebral aneurysm without rupture    ED Discharge Orders         Ordered    naproxen (NAPROSYN) 375 MG tablet  2 times daily     09/05/18 1550    cyclobenzaprine (FLEXERIL) 10 MG tablet  2 times daily PRN     09/05/18 1550           Rolan Bucco, MD 09/05/18 1601

## 2018-09-05 NOTE — ED Triage Notes (Signed)
Patient here with neck pain starting yesterday morning that radiates to her right arm. She states her right leg hurts too and feels like it's coming from her neck. Says she had a headache yesterday from it, but is now gone.

## 2018-09-05 NOTE — ED Notes (Signed)
Pt enrolled in aromatherapy pain trial 

## 2018-09-05 NOTE — ED Notes (Signed)
Pt ambulatory around department with visitor. Appears to be in NAD.

## 2018-09-08 LAB — CSF CULTURE: CULTURE: NO GROWTH

## 2018-09-08 LAB — CSF CULTURE W GRAM STAIN

## 2019-07-16 ENCOUNTER — Other Ambulatory Visit: Payer: Self-pay | Admitting: Neurosurgery

## 2019-07-16 DIAGNOSIS — I671 Cerebral aneurysm, nonruptured: Secondary | ICD-10-CM

## 2019-07-17 ENCOUNTER — Other Ambulatory Visit: Payer: Self-pay | Admitting: Neurosurgery

## 2019-07-30 ENCOUNTER — Other Ambulatory Visit (HOSPITAL_COMMUNITY)
Admission: RE | Admit: 2019-07-30 | Discharge: 2019-07-30 | Disposition: A | Discharge status: Home / Self Care | Payer: BC Managed Care – PPO | Source: Ambulatory Visit | Attending: Neurosurgery | Admitting: Neurosurgery

## 2019-07-30 DIAGNOSIS — Z20822 Contact with and (suspected) exposure to covid-19: Secondary | ICD-10-CM | POA: Diagnosis not present

## 2019-07-30 DIAGNOSIS — Z01812 Encounter for preprocedural laboratory examination: Secondary | ICD-10-CM | POA: Insufficient documentation

## 2019-07-30 LAB — SARS CORONAVIRUS 2 (TAT 6-24 HRS): SARS Coronavirus 2: NEGATIVE

## 2019-08-01 NOTE — H&P (Signed)
Chief Complaint   aneurysm  HPI   HPI: Alyssa Brooks is a 34 y.o. 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  She presents today for diagnostic cerebral angiogram for further workup and characterization.  She is without any concerns.  There are no problems to display for this patient.   PMH: Past Medical History:  Diagnosis Date  . Bipolar 1 disorder (HCC)     PSH: Past Surgical History:  Procedure Laterality Date  . BREAST CYST EXCISION    . TUBAL LIGATION      (Not in a hospital admission)   SH: Social History   Tobacco Use  . Smoking status: Current Every Day Smoker    Packs/day: 0.50    Types: Cigarettes  . Smokeless tobacco: Never Used  Substance Use Topics  . Alcohol use: Yes    Comment: rare  . Drug use: Yes    Frequency: 7.0 times per week    Types: Marijuana    MEDS: Prior to Admission medications   Medication Sig Start Date End Date Taking? Authorizing Provider  cyclobenzaprine (FLEXERIL) 10 MG tablet Take 1 tablet (10 mg total) by mouth 2 (two) times daily as needed for muscle spasms. 09/05/18   Rolan Bucco, MD  naproxen (NAPROSYN) 375 MG tablet Take 1 tablet (375 mg total) by mouth 2 (two) times daily. 09/05/18   Rolan Bucco, MD  promethazine (PHENERGAN) 25 MG suppository Place 1 suppository (25 mg total) rectally every 6 (six) hours as needed for nausea or vomiting. 08/10/17   Long, Arlyss Repress, MD    ALLERGY: Allergies  Allergen Reactions  . Keflex [Cephalexin Monohydrate] Diarrhea and Nausea And Vomiting  . Penicillins Cross Reactors Nausea And Vomiting    Social History   Tobacco Use  . Smoking status: Current Every Day Smoker    Packs/day: 0.50    Types: Cigarettes  . Smokeless tobacco: Never Used  Substance Use Topics  . Alcohol use: Yes    Comment: rare     Family History  Problem Relation Age of Onset  . Cancer Mother        cervical  . Cancer  Maternal Grandmother        uterine  . Hypertension Maternal Grandfather   . Diabetes Maternal Grandfather      ROS   ROS  Exam   There were no vitals filed for this visit. General appearance: WDWN, NAD Eyes: No scleral injection Cardiovascular: Regular rate and rhythm without murmurs, rubs, gallops. No edema or variciosities. Distal pulses normal. Pulmonary: Effort normal, non-labored breathing Musculoskeletal:     Muscle tone upper extremities: Normal    Muscle tone lower extremities: Normal    Motor exam: Upper Extremities Deltoid Bicep Tricep Grip  Right 5/5 5/5 5/5 5/5  Left 5/5 5/5 5/5 5/5   Lower Extremity IP Quad PF DF EHL  Right 5/5 5/5 5/5 5/5 5/5  Left 5/5 5/5 5/5 5/5 5/5   Neurological Mental Status:    - Patient is awake, alert, oriented to person, place, month, year, and situation    - Patient is able to give a clear and coherent history.    - No signs of aphasia or neglect Cranial Nerves    - II: Visual Fields are full. PERRL    - III/IV/VI: EOMI without ptosis or diploplia.     - V: Facial sensation is grossly normal    - VII: Facial movement  is symmetric.     - VIII: hearing is intact to voice    - X: Uvula elevates symmetrically    - XI: Shoulder shrug is symmetric.    - XII: tongue is midline without atrophy or fasciculations.   Results - Imaging/Labs   Results for orders placed or performed during the hospital encounter of 07/30/19 (from the past 48 hour(s))  SARS CORONAVIRUS 2 (TAT 6-24 HRS) Nasopharyngeal Nasopharyngeal Swab     Status: None   Collection Time: 07/30/19 10:26 AM   Specimen: Nasopharyngeal Swab  Result Value Ref Range   SARS Coronavirus 2 NEGATIVE NEGATIVE    Comment: (NOTE) SARS-CoV-2 target nucleic acids are NOT DETECTED. The SARS-CoV-2 RNA is generally detectable in upper and lower respiratory specimens during the acute phase of infection. Negative results do not preclude SARS-CoV-2 infection, do not rule  out co-infections with other pathogens, and should not be used as the sole basis for treatment or other patient management decisions. Negative results must be combined with clinical observations, patient history, and epidemiological information. The expected result is Negative. Fact Sheet for Patients: SugarRoll.be Fact Sheet for Healthcare Providers: https://www.woods-mathews.com/ This test is not yet approved or cleared by the [place] FDA and  has been authorized for detection and/or diagnosis of SARS-CoV-2 by FDA under an Emergency Use Authorization (EUA). This EUA will remain  in effect (meaning this test can be used) for the duration of the COVID-19 declaration under Section 56 4(b)(1) of the Act, 21 U.S.C. section 360bbb-3(b)(1), unless the authorization is terminated or revoked sooner. Performed at [facility], [place] [address] [place], [place] [address]     No results found.   CT angiogram of the head dated 09/05/2018 was reviewed.  This does appear to demonstrate an aneurysm in the right ophthalmic segment of the internal carotid artery.  It almost appears to be a fusiform dilatation of the artery at this level, as the aneurysm neck is not readily identifiable.  No other aneurysms are identified.   Impression/Plan   34 y.o. 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  We will proceed with diagnostic cerebral angiogram for further evaluation and characterization.  Risks, benefits and alternatives were discussed.  Patient stated understanding &  Wished to proceed.  Ferne Reus, PA-C [facility] @Cur[employer] @

## 2019-08-02 ENCOUNTER — Other Ambulatory Visit: Payer: Self-pay | Admitting: Neurosurgery

## 2019-08-02 ENCOUNTER — Other Ambulatory Visit: Payer: Self-pay

## 2019-08-02 ENCOUNTER — Ambulatory Visit (HOSPITAL_COMMUNITY)
Admission: RE | Admit: 2019-08-02 | Discharge: 2019-08-02 | Disposition: A | Discharge status: Home / Self Care | Payer: BC Managed Care – PPO | Source: Ambulatory Visit | Attending: Neurosurgery | Admitting: Neurosurgery

## 2019-08-02 DIAGNOSIS — Z880 Allergy status to penicillin: Secondary | ICD-10-CM | POA: Insufficient documentation

## 2019-08-02 DIAGNOSIS — I671 Cerebral aneurysm, nonruptured: Secondary | ICD-10-CM

## 2019-08-02 DIAGNOSIS — F17210 Nicotine dependence, cigarettes, uncomplicated: Secondary | ICD-10-CM | POA: Insufficient documentation

## 2019-08-02 DIAGNOSIS — Z8249 Family history of ischemic heart disease and other diseases of the circulatory system: Secondary | ICD-10-CM | POA: Insufficient documentation

## 2019-08-02 DIAGNOSIS — Z791 Long term (current) use of non-steroidal anti-inflammatories (NSAID): Secondary | ICD-10-CM | POA: Insufficient documentation

## 2019-08-02 DIAGNOSIS — Z881 Allergy status to other antibiotic agents status: Secondary | ICD-10-CM | POA: Insufficient documentation

## 2019-08-02 DIAGNOSIS — Z9851 Tubal ligation status: Secondary | ICD-10-CM | POA: Diagnosis not present

## 2019-08-02 HISTORY — PX: IR ANGIO INTRA EXTRACRAN SEL INTERNAL CAROTID BILAT MOD SED: IMG5363

## 2019-08-02 HISTORY — PX: IR ANGIO VERTEBRAL SEL VERTEBRAL UNI R MOD SED: IMG5368

## 2019-08-02 LAB — BASIC METABOLIC PANEL WITH GFR
Anion gap: 7 (ref 5–15)
BUN: 10 mg/dL (ref 6–20)
CO2: 24 mmol/L (ref 22–32)
Calcium: 8.8 mg/dL — ABNORMAL LOW (ref 8.9–10.3)
Chloride: 108 mmol/L (ref 98–111)
Creatinine, Ser: 0.85 mg/dL (ref 0.44–1.00)
GFR calc Af Amer: >60 mL/min
GFR calc non Af Amer: >60 mL/min
Glucose, Bld: 87 mg/dL (ref 70–99)
Potassium: 4.1 mmol/L (ref 3.5–5.1)
Sodium: 139 mmol/L (ref 135–145)

## 2019-08-02 LAB — CBC WITH DIFFERENTIAL/PLATELET
Abs Immature Granulocytes: 0.02 K/uL (ref 0.00–0.07)
Basophils Absolute: 0 K/uL (ref 0.0–0.1)
Basophils Relative: 1 %
Eosinophils Absolute: 0.4 K/uL (ref 0.0–0.5)
Eosinophils Relative: 6 %
HCT: 34.8 % — ABNORMAL LOW (ref 36.0–46.0)
Hemoglobin: 11.2 g/dL — ABNORMAL LOW (ref 12.0–15.0)
Immature Granulocytes: 0 %
Lymphocytes Relative: 37 %
Lymphs Abs: 2.3 K/uL (ref 0.7–4.0)
MCH: 28 pg (ref 26.0–34.0)
MCHC: 32.2 g/dL (ref 30.0–36.0)
MCV: 87 fL (ref 80.0–100.0)
Monocytes Absolute: 0.7 K/uL (ref 0.1–1.0)
Monocytes Relative: 11 %
Neutro Abs: 2.9 K/uL (ref 1.7–7.7)
Neutrophils Relative %: 45 %
Platelets: 301 K/uL (ref 150–400)
RBC: 4 MIL/uL (ref 3.87–5.11)
RDW: 14.6 % (ref 11.5–15.5)
WBC: 6.3 K/uL (ref 4.0–10.5)
nRBC: 0 % (ref 0.0–0.2)

## 2019-08-02 LAB — URINALYSIS, ROUTINE W REFLEX MICROSCOPIC
Bilirubin Urine: NEGATIVE
Glucose, UA: NEGATIVE mg/dL
Hgb urine dipstick: NEGATIVE
Ketones, ur: NEGATIVE mg/dL
Leukocytes,Ua: NEGATIVE
Nitrite: NEGATIVE
Protein, ur: 30 mg/dL — AB
Specific Gravity, Urine: 1.026 (ref 1.005–1.030)
pH: 6 (ref 5.0–8.0)

## 2019-08-02 LAB — PROTIME-INR
INR: 1 (ref 0.8–1.2)
Prothrombin Time: 13.4 seconds (ref 11.4–15.2)

## 2019-08-02 LAB — APTT: aPTT: 29 s (ref 24–36)

## 2019-08-02 MED ORDER — NITROGLYCERIN 1 MG/10 ML FOR IR/CATH LAB
INTRA_ARTERIAL | Status: AC
  Filled 2019-08-02: qty 10

## 2019-08-02 MED ORDER — HYDROCODONE-ACETAMINOPHEN 5-325 MG PO TABS: 1.0000 | ORAL_TABLET | ORAL | Status: DC | PRN

## 2019-08-02 MED ORDER — FENTANYL CITRATE (PF) 100 MCG/2ML IJ SOLN
INTRAMUSCULAR | Status: AC | PRN
  Administered 2019-08-02: 25 ug via INTRAVENOUS

## 2019-08-02 MED ORDER — HEPARIN SODIUM (PORCINE) 1000 UNIT/ML IJ SOLN
INTRAMUSCULAR | Status: AC | PRN
  Administered 2019-08-02: 3000 [IU] via INTRAVENOUS

## 2019-08-02 MED ORDER — CHLORHEXIDINE GLUCONATE CLOTH 2 % EX PADS: 6.0000 | MEDICATED_PAD | Freq: Once | CUTANEOUS | Status: DC

## 2019-08-02 MED ORDER — FENTANYL CITRATE (PF) 100 MCG/2ML IJ SOLN
INTRAMUSCULAR | Status: AC
  Filled 2019-08-02: qty 2

## 2019-08-02 MED ORDER — VERAPAMIL HCL 2.5 MG/ML IV SOLN
INTRAVENOUS | Status: AC | PRN
  Administered 2019-08-02: 2.5 mg via INTRA_ARTERIAL

## 2019-08-02 MED ORDER — SODIUM CHLORIDE 0.9 % IV SOLN: INTRAVENOUS | Status: DC

## 2019-08-02 MED ORDER — HYDROCODONE-ACETAMINOPHEN 5-325 MG PO TABS
ORAL_TABLET | ORAL | Status: AC
  Administered 2019-08-02: 13:00:00 1 via ORAL
  Filled 2019-08-02: qty 1

## 2019-08-02 MED ORDER — HEPARIN SODIUM (PORCINE) 1000 UNIT/ML IJ SOLN
INTRAMUSCULAR | Status: AC
  Filled 2019-08-02: qty 1

## 2019-08-02 MED ORDER — MIDAZOLAM HCL 2 MG/2ML IJ SOLN
INTRAMUSCULAR | Status: AC | PRN
  Administered 2019-08-02: 1 mg via INTRAVENOUS

## 2019-08-02 MED ORDER — NITROGLYCERIN 1 MG/10 ML FOR IR/CATH LAB
INTRA_ARTERIAL | Status: AC | PRN
  Administered 2019-08-02: 100 ug via INTRA_ARTERIAL

## 2019-08-02 MED ORDER — MIDAZOLAM HCL 2 MG/2ML IJ SOLN
INTRAMUSCULAR | Status: AC
  Filled 2019-08-02: qty 2

## 2019-08-02 MED ORDER — VANCOMYCIN HCL IN DEXTROSE 1-5 GM/200ML-% IV SOLN: 1000.0000 mg | INTRAVENOUS | Status: DC

## 2019-08-02 MED ORDER — VERAPAMIL HCL 2.5 MG/ML IV SOLN
INTRAVENOUS | Status: AC
  Filled 2019-08-02: qty 2

## 2019-08-02 MED ORDER — LIDOCAINE HCL 1 % IJ SOLN
INTRAMUSCULAR | Status: AC
  Filled 2019-08-02: qty 20

## 2019-08-02 MED ORDER — IOHEXOL 300 MG/ML  SOLN
100.0000 mL | Freq: Once | INTRAMUSCULAR | Status: AC | PRN
  Administered 2019-08-02: 50 mL via INTRA_ARTERIAL

## 2019-08-02 NOTE — Progress Notes (Signed)
Ambulated in hallway and to bathroom to void tol well 

## 2019-08-02 NOTE — H&P (Signed)
Chief Complaint   aneurysm  HPI   HPI: Alyssa Brooks is a 34 y.o. 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  She presents today for diagnostic cerebral angiogram for further workup and characterization.  She is without any concerns.  There are no problems to display for this patient.   PMH: Past Medical History:  Diagnosis Date  . Bipolar 1 disorder (HCC)     PSH: Past Surgical History:  Procedure Laterality Date  . BREAST CYST EXCISION    . TUBAL LIGATION      (Not in a hospital admission)   SH: Social History   Tobacco Use  . Smoking status: Current Every Day Smoker    Packs/day: 0.50    Types: Cigarettes  . Smokeless tobacco: Never Used  Substance Use Topics  . Alcohol use: Yes    Comment: rare  . Drug use: Yes    Frequency: 7.0 times per week    Types: Marijuana    MEDS: Prior to Admission medications   Medication Sig Start Date End Date Taking? Authorizing Provider  cyclobenzaprine (FLEXERIL) 10 MG tablet Take 1 tablet (10 mg total) by mouth 2 (two) times daily as needed for muscle spasms. 09/05/18   Malvin Johns, MD  naproxen (NAPROSYN) 375 MG tablet Take 1 tablet (375 mg total) by mouth 2 (two) times daily. 09/05/18   Malvin Johns, MD  promethazine (PHENERGAN) 25 MG suppository Place 1 suppository (25 mg total) rectally every 6 (six) hours as needed for nausea or vomiting. 08/10/17   Long, Wonda Olds, MD    ALLERGY: Allergies  Allergen Reactions  . Keflex [Cephalexin Monohydrate] Diarrhea and Nausea And Vomiting  . Penicillins Cross Reactors Nausea And Vomiting    Social History   Tobacco Use  . Smoking status: Current Every Day Smoker    Packs/day: 0.50    Types: Cigarettes  . Smokeless tobacco: Never Used  Substance Use Topics  . Alcohol use: Yes    Comment: rare     Family History  Problem Relation Age of Onset  . Cancer Mother        cervical  . Cancer  Maternal Grandmother        uterine  . Hypertension Maternal Grandfather   . Diabetes Maternal Grandfather      ROS   ROS  Exam   Vitals:   08/02/19 0827  BP: 125/84  Pulse: 62  Resp: 14  Temp: 98.2 F (36.8 C)  SpO2: 100%   General appearance: WDWN, NAD Eyes: No scleral injection Cardiovascular: Regular rate and rhythm without murmurs, rubs, gallops. No edema or variciosities. Distal pulses normal. Pulmonary: Effort normal, non-labored breathing Musculoskeletal:     Muscle tone upper extremities: Normal    Muscle tone lower extremities: Normal    Motor exam: Upper Extremities Deltoid Bicep Tricep Grip  Right 5/5 5/5 5/5 5/5  Left 5/5 5/5 5/5 5/5   Lower Extremity IP Quad PF DF EHL  Right 5/5 5/5 5/5 5/5 5/5  Left 5/5 5/5 5/5 5/5 5/5   Neurological Mental Status:    - Patient is awake, alert, oriented to person, place, month, year, and situation    - Patient is able to give a clear and coherent history.    - No signs of aphasia or neglect Cranial Nerves    - II: Visual Fields are full. PERRL    - III/IV/VI: EOMI without ptosis or diploplia.     -  V: Facial sensation is grossly normal    - VII: Facial movement is symmetric.     - VIII: hearing is intact to voice    - X: Uvula elevates symmetrically    - XI: Shoulder shrug is symmetric.    - XII: tongue is midline without atrophy or fasciculations.   Results - Imaging/Labs   Results for orders placed or performed during the hospital encounter of 08/02/19 (from the past 48 hour(s))  Urinalysis, Routine w reflex microscopic     Status: Abnormal   Collection Time: 08/02/19  8:54 AM  Result Value Ref Range   Color, Urine YELLOW YELLOW   APPearance CLEAR CLEAR   Specific Gravity, Urine 1.026 1.005 - 1.030   pH 6.0 5.0 - 8.0   Glucose, UA NEGATIVE NEGATIVE mg/dL   Hgb urine dipstick NEGATIVE NEGATIVE   Bilirubin Urine NEGATIVE NEGATIVE   Ketones, ur NEGATIVE NEGATIVE mg/dL   Protein, ur 30 (A) NEGATIVE mg/dL    Nitrite NEGATIVE NEGATIVE   Leukocytes,Ua NEGATIVE NEGATIVE   RBC / HPF 0-5 0 - 5 RBC/hpf   WBC, UA 0-5 0 - 5 WBC/hpf   Bacteria, UA RARE (A) NONE SEEN   Squamous Epithelial / LPF 0-5 0 - 5   Mucus PRESENT     Comment: Performed at [facility], [address] [place], [place] [address]    No results found.   CT angiogram of the head dated 09/05/2018 was reviewed.  This does appear to demonstrate an aneurysm in the right ophthalmic segment of the internal carotid artery.  It almost appears to be a fusiform dilatation of the artery at this level, as the aneurysm neck is not readily identifiable.  No other aneurysms are identified.   Impression/Plan   34 y.o. 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  We will proceed with diagnostic cerebral angiogram for further evaluation and characterization.  Risks, benefits and alternatives were discussed at length in the office. All questions today were answered and consent was obtained.   Lisbeth Renshaw, MD [facility]

## 2019-08-02 NOTE — Progress Notes (Signed)
Discharge instructions reviewed with pt and Rhonda (via telephone) both voice understanding.  

## 2019-08-02 NOTE — Sedation Documentation (Signed)
TR band 13 cc of air.

## 2019-08-02 NOTE — Discharge Instructions (Signed)
Radial Site Care  This sheet gives you information about how to care for yourself after your procedure. Your health care provider may also give you more specific instructions. If you have problems or questions, contact your health care provider. What can I expect after the procedure? After the procedure, it is common to have:  Bruising and tenderness at the catheter insertion area. Follow these instructions at home: Medicines  Take over-the-counter and prescription medicines only as told by your health care provider. Insertion site care  Follow instructions from your health care provider about how to take care of your insertion site. Make sure you: ? Wash your hands with soap and water before you change your bandage (dressing). If soap and water are not available, use hand sanitizer. ? Change your dressing as told by your health care provider. ? Leave stitches (sutures), skin glue, or adhesive strips in place. These skin closures may need to stay in place for 2 weeks or longer. If adhesive strip edges start to loosen and curl up, you may trim the loose edges. Do not remove adhesive strips completely unless your health care provider tells you to do that.  Check your insertion site every day for signs of infection. Check for: ? Redness, swelling, or pain. ? Fluid or blood. ? Pus or a bad smell. ? Warmth.  Do not take baths, swim, or use a hot tub until your health care provider approves.  You may shower 24-48 hours after the procedure, or as directed by your health care provider. ? Remove the dressing and gently wash the site with plain soap and water. ? Pat the area dry with a clean towel. ? Do not rub the site. That could cause bleeding.  Do not apply powder or lotion to the site. Activity   For 24 hours after the procedure, or as directed by your health care provider: ? Do not flex or bend the affected arm. ? Do not push or pull heavy objects with the affected arm. ? Do not  drive yourself home from the hospital or clinic. You may drive 24 hours after the procedure unless your health care provider tells you not to. ? Do not operate machinery or power tools.  Do not lift anything that is heavier than 10 lb (4.5 kg), or the limit that you are told, until your health care provider says that it is safe.  Ask your health care provider when it is okay to: ? Return to work or school. ? Resume usual physical activities or sports. ? Resume sexual activity. General instructions  If the catheter site starts to bleed, raise your arm and put firm pressure on the site. If the bleeding does not stop, get help right away. This is a medical emergency.  If you went home on the same day as your procedure, a responsible adult should be with you for the first 24 hours after you arrive home.  Keep all follow-up visits as told by your health care provider. This is important. Contact a health care provider if:  You have a fever.  You have redness, swelling, or yellow drainage around your insertion site. Get help right away if:  You have unusual pain at the radial site.  The catheter insertion area swells very fast.  The insertion area is bleeding, and the bleeding does not stop when you hold steady pressure on the area.  Your arm or hand becomes pale, cool, tingly, or numb. These symptoms may represent a serious problem   that is an emergency. Do not wait to see if the symptoms will go away. Get medical help right away. Call your local emergency services (911 in the [place] Do not drive yourself to the hospital. Summary  After the procedure, it is common to have bruising and tenderness at the site.  Follow instructions from your health care provider about how to take care of your radial site wound. Check the wound every day for signs of infection.  Do not lift anything that is heavier than 10 lb (4.5 kg), or the limit that you are told, until your health care provider says  that it is safe. This information is not intended to replace advice given to you by your health care provider. Make sure you discuss any questions you have with your health care provider. Document Revised: 07/26/2017 Document Reviewed: 07/26/2017 Elsevier Patient Education  2020 [employer]  

## 2019-08-02 NOTE — Sedation Documentation (Addendum)
Patient heart rate irregular. Patient heart rate 40-70s with periods of prolongation of PR interval. Dr. Conchita Paris informed of heart rhythm. Patient asymptomatic and states that she feels fine. BP 137-93.

## 2019-08-21 ENCOUNTER — Encounter (HOSPITAL_COMMUNITY): Payer: Self-pay

## 2019-09-16 ENCOUNTER — Other Ambulatory Visit: Payer: Self-pay | Admitting: Neurosurgery

## 2019-09-19 ENCOUNTER — Other Ambulatory Visit: Payer: Self-pay | Admitting: Neurosurgery

## 2019-10-03 NOTE — Progress Notes (Addendum)
Alyssa Brooks   Your procedure is scheduled on Tuesday April 6.  Report to [facility] Main Entrance "A" at 10:45 A.M., and check in at the Admitting office.  Call this number if you have problems the morning of surgery: [phone]  Call [phone] if you have any questions prior to your surgery date Monday-Friday 8am-4pm   Remember: Do not eat or drink after midnight the night before your surgery   >>Per your surgeon's instructions, continue taking Aspirin and Plavix as instructed.   7 days prior to surgery STOP taking any Aspirin containing products, Aleve, Naproxen, Ibuprofen, Motrin, Advil, Goody's, BC's, all herbal medications, fish oil, and all vitamins.    The Morning of Surgery  Do not wear jewelry, make-up or nail polish.  Do not wear lotions, powders, perfumes, or deodorant  Do not shave 48 hours prior to surgery.    Do not bring valuables to the hospital.  [facility] is not responsible for any belongings or valuables.  If you are a smoker, DO NOT Smoke 24 hours prior to surgery  If you wear a CPAP at night please bring your mask the morning of surgery   Remember that you must have someone to transport you home after your surgery, and remain with you for 24 hours if you are discharged the same day.   Please bring cases for contacts, glasses, hearing aids, dentures or bridgework because it cannot be worn into surgery.    Leave your suitcase in the car.  After surgery it may be brought to your room.  For patients admitted to the hospital, discharge time will be determined by your treatment team.  Patients discharged the day of surgery will not be allowed to drive home.    Special instructions:   [facility]- Preparing For Surgery  Before surgery, you can play an important role. Because skin is not sterile, your skin needs to be as free of germs as possible. You can reduce the number of germs on your skin by washing with CHG (chlorahexidine gluconate) Soap  before surgery.  CHG is an antiseptic cleaner which kills germs and bonds with the skin to continue killing germs even after washing.    Oral Hygiene is also important to reduce your risk of infection.  Remember - BRUSH YOUR TEETH THE MORNING OF SURGERY WITH YOUR REGULAR TOOTHPASTE  Please do not use if you have an allergy to CHG or antibacterial soaps. If your skin becomes reddened/irritated stop using the CHG.  Do not shave (including legs and underarms) for at least 48 hours prior to first CHG shower. It is OK to shave your face.  Please follow these instructions carefully.   1. Shower the NIGHT BEFORE SURGERY and the MORNING OF SURGERY with CHG Soap.   2. If you chose to wash your hair and body, wash as usual with your normal shampoo and body-wash/soap.  3. Rinse your hair and body thoroughly to remove the shampoo and soap.  4. Apply CHG directly to the skin (ONLY FROM THE NECK DOWN) and wash gently with a scrungie or a clean washcloth.   5. Do not use on open wounds or open sores. Avoid contact with your eyes, ears, mouth and genitals (private parts). Wash Face and genitals (private parts)  with your normal soap.   6. Wash thoroughly, paying special attention to the area where your surgery will be performed.  7. Thoroughly rinse your body with warm water from the neck down.  8. DO NOT  shower/wash with your normal soap after using and rinsing off the CHG Soap.  9. Pat yourself dry with a CLEAN TOWEL.  10. Wear CLEAN PAJAMAS to bed the night before surgery  11. Place CLEAN SHEETS on your bed the night of your first shower and DO NOT SLEEP WITH PETS.  12. Wear comfortable clothes the morning of surgery.     Day of Surgery:  Please shower the morning of surgery with the CHG soap Do not apply any deodorants/lotions. Please wear clean clothes to the hospital/surgery center.   Remember to brush your teeth WITH YOUR REGULAR TOOTHPASTE.   Please read over the following fact  sheets that you were given.

## 2019-10-04 ENCOUNTER — Other Ambulatory Visit (HOSPITAL_COMMUNITY)
Admission: RE | Admit: 2019-10-04 | Discharge: 2019-10-04 | Disposition: A | Discharge status: Home / Self Care | Payer: BC Managed Care – PPO | Source: Ambulatory Visit | Attending: Neurosurgery | Admitting: Neurosurgery

## 2019-10-04 ENCOUNTER — Encounter (HOSPITAL_COMMUNITY)
Admission: RE | Admit: 2019-10-04 | Discharge: 2019-10-04 | Disposition: A | Discharge status: Home / Self Care | Payer: BC Managed Care – PPO | Source: Ambulatory Visit | Attending: Neurosurgery | Admitting: Neurosurgery

## 2019-10-04 ENCOUNTER — Encounter (HOSPITAL_COMMUNITY): Payer: Self-pay

## 2019-10-04 ENCOUNTER — Other Ambulatory Visit: Payer: Self-pay

## 2019-10-04 DIAGNOSIS — Z01812 Encounter for preprocedural laboratory examination: Secondary | ICD-10-CM | POA: Insufficient documentation

## 2019-10-04 DIAGNOSIS — Z20822 Contact with and (suspected) exposure to covid-19: Secondary | ICD-10-CM | POA: Diagnosis not present

## 2019-10-04 HISTORY — DX: Headache, unspecified: R51.9

## 2019-10-04 LAB — BASIC METABOLIC PANEL
Anion gap: 10 (ref 5–15)
BUN: 9 mg/dL (ref 6–20)
CO2: 23 mmol/L (ref 22–32)
Calcium: 8.8 mg/dL — ABNORMAL LOW (ref 8.9–10.3)
Chloride: 106 mmol/L (ref 98–111)
Creatinine, Ser: 0.78 mg/dL (ref 0.44–1.00)
GFR calc Af Amer: >60 mL/min (ref >60)
GFR calc non Af Amer: >60 mL/min (ref >60)
Glucose, Bld: 99 mg/dL (ref 70–99)
Potassium: 4.1 mmol/L (ref 3.5–5.1)
Sodium: 139 mmol/L (ref 135–145)

## 2019-10-04 LAB — CBC WITH DIFFERENTIAL/PLATELET
Abs Immature Granulocytes: 0.06 10*3/uL (ref 0.00–0.07)
Basophils Absolute: 0.1 10*3/uL (ref 0.0–0.1)
Basophils Relative: 1 %
Eosinophils Absolute: 0.2 10*3/uL (ref 0.0–0.5)
Eosinophils Relative: 3 %
HCT: 37.8 % (ref 36.0–46.0)
Hemoglobin: 12.2 g/dL (ref 12.0–15.0)
Immature Granulocytes: 1 %
Lymphocytes Relative: 39 %
Lymphs Abs: 2.9 10*3/uL (ref 0.7–4.0)
MCH: 28.4 pg (ref 26.0–34.0)
MCHC: 32.3 g/dL (ref 30.0–36.0)
MCV: 87.9 fL (ref 80.0–100.0)
Monocytes Absolute: 0.8 10*3/uL (ref 0.1–1.0)
Monocytes Relative: 10 %
Neutro Abs: 3.6 10*3/uL (ref 1.7–7.7)
Neutrophils Relative %: 46 %
Platelets: 307 10*3/uL (ref 150–400)
RBC: 4.3 MIL/uL (ref 3.87–5.11)
RDW: 14.8 % (ref 11.5–15.5)
WBC: 7.6 10*3/uL (ref 4.0–10.5)
nRBC: 0 % (ref 0.0–0.2)

## 2019-10-04 LAB — URINALYSIS, ROUTINE W REFLEX MICROSCOPIC
Bilirubin Urine: NEGATIVE
Glucose, UA: NEGATIVE mg/dL
Hgb urine dipstick: NEGATIVE
Ketones, ur: NEGATIVE mg/dL
Leukocytes,Ua: NEGATIVE
Nitrite: NEGATIVE
Protein, ur: NEGATIVE mg/dL
Specific Gravity, Urine: 1.018 (ref 1.005–1.030)
pH: 6 (ref 5.0–8.0)

## 2019-10-04 LAB — SARS CORONAVIRUS 2 (TAT 6-24 HRS): SARS Coronavirus 2: NEGATIVE

## 2019-10-04 LAB — APTT: aPTT: 29 seconds (ref 24–36)

## 2019-10-04 LAB — PROTIME-INR
INR: 1.1 (ref 0.8–1.2)
Prothrombin Time: 13.8 seconds (ref 11.4–15.2)

## 2019-10-04 NOTE — Progress Notes (Signed)
PCP - denies Cardiologist - denies  Chest x-ray - n/a EKG - n/a  Blood Thinner Instructions: Continue ASA & Plavix on DOS, per surgeon's instructions   COVID TEST- 10-04-19   Anesthesia review: n/a  Patient denies shortness of breath, fever, cough and chest pain at PAT appointment   All instructions explained to the patient, with a verbal understanding of the material. Patient agrees to go over the instructions while at home for a better understanding. Patient also instructed to self quarantine after being tested for COVID-19. The opportunity to ask questions was provided.

## 2019-10-07 ENCOUNTER — Other Ambulatory Visit (HOSPITAL_COMMUNITY): Payer: Self-pay | Admitting: Neurosurgery

## 2019-10-07 DIAGNOSIS — I671 Cerebral aneurysm, nonruptured: Secondary | ICD-10-CM

## 2019-10-07 NOTE — H&P (Signed)
Chief Complaint   aneurysm  HPI   HPI: Alyssa Brooks is a 34 y.o. female who was incidentally found to have a right cavernous carotid aneurysm during workup headache.   She presents today for surpass embolization of the right carotid aneurysm.  She has been taking aspirin and Plavix for the past 7 days in preparation for surgery today.  There are no problems to display for this patient.   PMH: Past Medical History:  Diagnosis Date  . Bipolar 1 disorder ([place])   . Headache     PSH: Past Surgical History:  Procedure Laterality Date  . BREAST CYST EXCISION    . IR ANGIO INTRA EXTRACRAN SEL INTERNAL CAROTID BILAT MOD SED  08/02/2019  . IR ANGIO VERTEBRAL SEL VERTEBRAL UNI R MOD SED  08/02/2019  . TUBAL LIGATION      No medications prior to admission.    SH: Social History   Tobacco Use  . Smoking status: Current Every Day Smoker    Packs/day: 0.50    Types: Cigarettes  . Smokeless tobacco: Never Used  Substance Use Topics  . Alcohol use: Yes    Comment: rare  . Drug use: Yes    Frequency: 7.0 times per week    Types: Marijuana    MEDS: Prior to Admission medications   Medication Sig Start Date End Date Taking? Authorizing Provider  Aspirin-Acetaminophen-Caffeine (GOODY HEADACHE PO) Take 1 packet by mouth daily as needed (headaches).   Yes [provider]  aspirin 325 MG tablet Take 325 mg by mouth daily.    [provider]  clopidogrel (PLAVIX) 75 MG tablet Take 75 mg by mouth daily.    [provider]    ALLERGY: Allergies  Allergen Reactions  . Adhesive [Tape]     Paper tape pulls off skin, regular adhesive tape is fine  . Keflex [Cephalexin Monohydrate] Diarrhea and Nausea And Vomiting  . Penicillins Nausea And Vomiting    Did it involve swelling of the face/tongue/throat, SOB, or low BP? No Did it involve sudden or severe rash/hives, skin peeling, or any reaction on the inside of your mouth or nose? No Did you need to seek  medical attention at a hospital or doctor's office? No When did it last happen?3 + years If all above answers are "NO", may proceed with cephalosporin use.     Social History   Tobacco Use  . Smoking status: Current Every Day Smoker    Packs/day: 0.50    Types: Cigarettes  . Smokeless tobacco: Never Used  Substance Use Topics  . Alcohol use: Yes    Comment: rare     Family History  Problem Relation Age of Onset  . Cancer Mother        cervical  . Cancer Maternal Grandmother        uterine  . Hypertension Maternal Grandfather   . Diabetes Maternal Grandfather      ROS   ROS  Exam   There were no vitals filed for this visit. General appearance: WDWN, NAD Eyes: No scleral injection Cardiovascular: Regular rate and rhythm without murmurs, rubs, gallops. No edema or variciosities. Distal pulses normal. Pulmonary: Effort normal, non-labored breathing Musculoskeletal:     Muscle tone upper extremities: Normal    Muscle tone lower extremities: Normal    Motor exam: Upper Extremities Deltoid Bicep Tricep Grip  Right 5/5 5/5 5/5 5/5  Left 5/5 5/5 5/5 5/5   Lower Extremity IP Quad PF DF EHL  Right 5/5 5/5 5/5 5/5 5/5  Left 5/5 5/5 5/5 5/5 5/5   Neurological Mental Status:    - Patient is awake, alert, oriented to person, place, month, year, and situation    - Patient is able to give a clear and coherent history.    - No signs of aphasia or neglect Cranial Nerves    - II: Visual Fields are full. PERRL    - III/IV/VI: EOMI without ptosis or diploplia.     - V: Facial sensation is grossly normal    - VII: Facial movement is symmetric.     - VIII: hearing is intact to voice    - X: Uvula elevates symmetrically    - XI: Shoulder shrug is symmetric.    - XII: tongue is midline without atrophy or fasciculations.  Sensory: Sensation grossly intact to LT  Results - Imaging/Labs   Diagnostic cerebral angiogram dated 08/02/2019 was reviewed.  This demonstrates the  presence of an approximately 6 millimeter distal cavernous carotid aneurysm.  Impression/Plan   34 y.o. female with incidentally discovered distal cavernous right carotid aneurysm.  We will proceed with Surpass embolization of the right carotid aneurysm.  She has been taking aspirin and Plavix daily for the past 7 days.  While in the office risks, benefits and alternatives were discussed. We reviewed expected postoperative course and recovery.  Likelihood of achieving goals of the procedure were also discussed. Patient is in understanding and wished to proceed. All her questions today were answered.  Lisbeth Renshaw, MD [facility]

## 2019-10-08 ENCOUNTER — Encounter (HOSPITAL_COMMUNITY)
Admission: RE | Disposition: A | Discharge status: Home / Self Care | Payer: Self-pay | Source: Home / Self Care | Attending: Neurosurgery

## 2019-10-08 ENCOUNTER — Ambulatory Visit (HOSPITAL_COMMUNITY)
Admission: RE | Admit: 2019-10-08 | Discharge: 2019-10-08 | Disposition: A | Discharge status: Home / Self Care | Payer: BC Managed Care – PPO | Source: Ambulatory Visit | Attending: Neurosurgery | Admitting: Neurosurgery

## 2019-10-08 ENCOUNTER — Inpatient Hospital Stay (HOSPITAL_COMMUNITY): Payer: BC Managed Care – PPO

## 2019-10-08 ENCOUNTER — Encounter (HOSPITAL_COMMUNITY): Payer: Self-pay | Admitting: Neurosurgery

## 2019-10-08 ENCOUNTER — Inpatient Hospital Stay (HOSPITAL_COMMUNITY): Payer: BC Managed Care – PPO | Admitting: Vascular Surgery

## 2019-10-08 ENCOUNTER — Inpatient Hospital Stay (HOSPITAL_COMMUNITY)
Admission: RE | Admit: 2019-10-08 | Discharge: 2019-10-12 | DRG: 270 | Disposition: A | Discharge status: Home / Self Care | Payer: BC Managed Care – PPO | Attending: Neurosurgery | Admitting: Neurosurgery

## 2019-10-08 ENCOUNTER — Other Ambulatory Visit: Payer: Self-pay

## 2019-10-08 DIAGNOSIS — T82856A Stenosis of peripheral vascular stent, initial encounter: Secondary | ICD-10-CM | POA: Diagnosis not present

## 2019-10-08 DIAGNOSIS — I609 Nontraumatic subarachnoid hemorrhage, unspecified: Secondary | ICD-10-CM | POA: Diagnosis present

## 2019-10-08 DIAGNOSIS — Z809 Family history of malignant neoplasm, unspecified: Secondary | ICD-10-CM

## 2019-10-08 DIAGNOSIS — R29701 NIHSS score 1: Secondary | ICD-10-CM | POA: Diagnosis present

## 2019-10-08 DIAGNOSIS — R4182 Altered mental status, unspecified: Secondary | ICD-10-CM | POA: Diagnosis not present

## 2019-10-08 DIAGNOSIS — Y753 Surgical instruments, materials and neurological devices (including sutures) associated with adverse incidents: Secondary | ICD-10-CM | POA: Diagnosis not present

## 2019-10-08 DIAGNOSIS — Y829 Unspecified medical devices associated with adverse incidents: Secondary | ICD-10-CM | POA: Diagnosis present

## 2019-10-08 DIAGNOSIS — Z20822 Contact with and (suspected) exposure to covid-19: Secondary | ICD-10-CM | POA: Diagnosis present

## 2019-10-08 DIAGNOSIS — I6529 Occlusion and stenosis of unspecified carotid artery: Secondary | ICD-10-CM | POA: Diagnosis present

## 2019-10-08 DIAGNOSIS — I720 Aneurysm of carotid artery: Principal | ICD-10-CM | POA: Diagnosis present

## 2019-10-08 DIAGNOSIS — Z7982 Long term (current) use of aspirin: Secondary | ICD-10-CM | POA: Diagnosis not present

## 2019-10-08 DIAGNOSIS — I441 Atrioventricular block, second degree: Secondary | ICD-10-CM | POA: Diagnosis present

## 2019-10-08 DIAGNOSIS — F319 Bipolar disorder, unspecified: Secondary | ICD-10-CM | POA: Diagnosis present

## 2019-10-08 DIAGNOSIS — R001 Bradycardia, unspecified: Secondary | ICD-10-CM | POA: Diagnosis not present

## 2019-10-08 DIAGNOSIS — Z880 Allergy status to penicillin: Secondary | ICD-10-CM

## 2019-10-08 DIAGNOSIS — Z91048 Other nonmedicinal substance allergy status: Secondary | ICD-10-CM | POA: Diagnosis not present

## 2019-10-08 DIAGNOSIS — Z7902 Long term (current) use of antithrombotics/antiplatelets: Secondary | ICD-10-CM

## 2019-10-08 DIAGNOSIS — I671 Cerebral aneurysm, nonruptured: Secondary | ICD-10-CM | POA: Diagnosis present

## 2019-10-08 DIAGNOSIS — I63529 Cerebral infarction due to unspecified occlusion or stenosis of unspecified anterior cerebral artery: Secondary | ICD-10-CM | POA: Diagnosis not present

## 2019-10-08 DIAGNOSIS — E232 Diabetes insipidus: Secondary | ICD-10-CM | POA: Diagnosis present

## 2019-10-08 DIAGNOSIS — I97821 Postprocedural cerebrovascular infarction during other surgery: Secondary | ICD-10-CM | POA: Diagnosis not present

## 2019-10-08 DIAGNOSIS — T82528A Displacement of other cardiac and vascular devices and implants, initial encounter: Secondary | ICD-10-CM | POA: Diagnosis not present

## 2019-10-08 DIAGNOSIS — Z833 Family history of diabetes mellitus: Secondary | ICD-10-CM | POA: Diagnosis not present

## 2019-10-08 DIAGNOSIS — F17210 Nicotine dependence, cigarettes, uncomplicated: Secondary | ICD-10-CM | POA: Diagnosis present

## 2019-10-08 DIAGNOSIS — I97621 Postprocedural hematoma of a circulatory system organ or structure following other procedure: Secondary | ICD-10-CM | POA: Diagnosis not present

## 2019-10-08 DIAGNOSIS — Z8249 Family history of ischemic heart disease and other diseases of the circulatory system: Secondary | ICD-10-CM | POA: Diagnosis not present

## 2019-10-08 DIAGNOSIS — I729 Aneurysm of unspecified site: Secondary | ICD-10-CM

## 2019-10-08 DIAGNOSIS — I67848 Other cerebrovascular vasospasm and vasoconstriction: Secondary | ICD-10-CM | POA: Diagnosis present

## 2019-10-08 HISTORY — PX: IR ANGIO INTRA EXTRACRAN SEL INTERNAL CAROTID BILAT MOD SED: IMG5363

## 2019-10-08 HISTORY — PX: IR TRANSCATH/EMBOLIZ: IMG695

## 2019-10-08 HISTORY — PX: RADIOLOGY WITH ANESTHESIA: SHX6223

## 2019-10-08 LAB — TYPE AND SCREEN
ABO/RH(D): A POS
Antibody Screen: NEGATIVE

## 2019-10-08 LAB — HEMOGLOBIN AND HEMATOCRIT, BLOOD
HCT: 30.3 % — ABNORMAL LOW (ref 36.0–46.0)
Hemoglobin: 9.7 g/dL — ABNORMAL LOW (ref 12.0–15.0)

## 2019-10-08 LAB — ABO/RH: ABO/RH(D): A POS

## 2019-10-08 LAB — POCT PREGNANCY, URINE: Preg Test, Ur: NEGATIVE

## 2019-10-08 SURGERY — IR WITH ANESTHESIA
Anesthesia: General | Laterality: Right

## 2019-10-08 MED ORDER — FENTANYL CITRATE (PF) 100 MCG/2ML IJ SOLN
INTRAMUSCULAR | Status: DC | PRN
  Administered 2019-10-08 (×2): 50 ug via INTRAVENOUS
  Administered 2019-10-08 (×2): 100 ug via INTRAVENOUS
  Administered 2019-10-08: 50 ug via INTRAVENOUS

## 2019-10-08 MED ORDER — EPTIFIBATIDE 20 MG/10ML IV SOLN
INTRAVENOUS | Status: AC
  Administered 2019-10-08: 7.3 mg
  Filled 2019-10-08: qty 10

## 2019-10-08 MED ORDER — FENTANYL CITRATE (PF) 100 MCG/2ML IJ SOLN
INTRAMUSCULAR | Status: AC
  Filled 2019-10-08: qty 2

## 2019-10-08 MED ORDER — CLOPIDOGREL BISULFATE 75 MG PO TABS
75.0000 mg | ORAL_TABLET | Freq: Every day | ORAL | Status: DC
  Administered 2019-10-09 – 2019-10-12 (×4): 75 mg via ORAL
  Filled 2019-10-08 (×4): qty 1

## 2019-10-08 MED ORDER — ACETAMINOPHEN 500 MG PO TABS
1000.0000 mg | ORAL_TABLET | Freq: Once | ORAL | Status: AC
  Administered 2019-10-08: 1000 mg via ORAL
  Filled 2019-10-08: qty 2

## 2019-10-08 MED ORDER — EPTIFIBATIDE 75 MG/100ML IV SOLN
2.0000 ug/kg/min | INTRAVENOUS | Status: DC
  Administered 2019-10-08: 2 ug/kg/min via INTRAVENOUS
  Filled 2019-10-08 (×2): qty 100

## 2019-10-08 MED ORDER — ASPIRIN 325 MG PO TABS: 325.0000 mg | ORAL_TABLET | Freq: Every day | ORAL | Status: DC

## 2019-10-08 MED ORDER — CLOPIDOGREL BISULFATE 75 MG PO TABS
ORAL_TABLET | ORAL | Status: AC
  Administered 2019-10-08: 75 mg
  Filled 2019-10-08: qty 1

## 2019-10-08 MED ORDER — FENTANYL CITRATE (PF) 100 MCG/2ML IJ SOLN
25.0000 ug | INTRAMUSCULAR | Status: DC | PRN
  Administered 2019-10-08 – 2019-10-12 (×13): 25 ug via INTRAVENOUS
  Filled 2019-10-08 (×9): qty 2

## 2019-10-08 MED ORDER — IOHEXOL 300 MG/ML  SOLN
100.0000 mL | Freq: Once | INTRAMUSCULAR | Status: AC | PRN
  Administered 2019-10-08: 80 mL via INTRA_ARTERIAL

## 2019-10-08 MED ORDER — CHLORHEXIDINE GLUCONATE CLOTH 2 % EX PADS: 6.0000 | MEDICATED_PAD | Freq: Once | CUTANEOUS | Status: DC

## 2019-10-08 MED ORDER — DEXAMETHASONE SODIUM PHOSPHATE 10 MG/ML IJ SOLN
INTRAMUSCULAR | Status: DC | PRN
  Administered 2019-10-08: 10 mg via INTRAVENOUS

## 2019-10-08 MED ORDER — SODIUM CHLORIDE 0.9 % IV SOLN
INTRAVENOUS | Status: DC | PRN
  Administered 2019-10-08: 14:00:00 20 ug/min via INTRAVENOUS

## 2019-10-08 MED ORDER — MIDAZOLAM HCL 2 MG/2ML IJ SOLN
INTRAMUSCULAR | Status: AC
  Filled 2019-10-08: qty 2

## 2019-10-08 MED ORDER — LABETALOL HCL 5 MG/ML IV SOLN
5.0000 mg | INTRAVENOUS | Status: DC | PRN
  Administered 2019-10-08: 20 mg via INTRAVENOUS
  Administered 2019-10-08 (×2): 10 mg via INTRAVENOUS
  Administered 2019-10-08: 20 mg via INTRAVENOUS
  Administered 2019-10-08: 10 mg via INTRAVENOUS
  Administered 2019-10-09: 20 mg via INTRAVENOUS
  Administered 2019-10-09: 10 mg via INTRAVENOUS
  Filled 2019-10-08: qty 4

## 2019-10-08 MED ORDER — HEPARIN SODIUM (PORCINE) 1000 UNIT/ML IJ SOLN
INTRAMUSCULAR | Status: DC | PRN
  Administered 2019-10-08 (×3): 1000 [IU] via INTRAVENOUS
  Administered 2019-10-08: 5000 [IU] via INTRAVENOUS
  Administered 2019-10-08: 1000 [IU] via INTRAVENOUS

## 2019-10-08 MED ORDER — MORPHINE SULFATE (PF) 2 MG/ML IV SOLN
2.0000 mg | INTRAVENOUS | Status: DC | PRN
  Administered 2019-10-08 – 2019-10-09 (×2): 2 mg via INTRAVENOUS
  Filled 2019-10-08 (×2): qty 1

## 2019-10-08 MED ORDER — ONDANSETRON HCL 4 MG/2ML IJ SOLN
INTRAMUSCULAR | Status: DC | PRN
  Administered 2019-10-08: 4 mg via INTRAVENOUS

## 2019-10-08 MED ORDER — EPTIFIBATIDE 75 MG/100ML IV SOLN
2.0000 ug/kg/min | INTRAVENOUS | Status: AC
  Filled 2019-10-08 (×3): qty 100

## 2019-10-08 MED ORDER — EPTIFIBATIDE 20 MG/10ML IV SOLN: 7.3000 mg | Freq: Once | INTRAVENOUS | Status: AC

## 2019-10-08 MED ORDER — PHENYLEPHRINE HCL-NACL 20-0.9 MG/250ML-% IV SOLN
INTRAVENOUS | Status: DC | PRN
  Administered 2019-10-08: 90 ug/min via INTRAVENOUS

## 2019-10-08 MED ORDER — DEXMEDETOMIDINE HCL IN NACL 400 MCG/100ML IV SOLN
0.4000 ug/kg/h | INTRAVENOUS | Status: DC
  Filled 2019-10-08: qty 100

## 2019-10-08 MED ORDER — LABETALOL HCL 5 MG/ML IV SOLN
INTRAVENOUS | Status: AC
  Filled 2019-10-08: qty 4

## 2019-10-08 MED ORDER — PHENYLEPHRINE HCL-NACL 10-0.9 MG/250ML-% IV SOLN
INTRAVENOUS | Status: DC | PRN
  Administered 2019-10-08: 60 ug/min via INTRAVENOUS

## 2019-10-08 MED ORDER — ASPIRIN EC 325 MG PO TBEC
DELAYED_RELEASE_TABLET | ORAL | Status: AC
  Administered 2019-10-08: 325 mg
  Filled 2019-10-08: qty 1

## 2019-10-08 MED ORDER — CLOPIDOGREL BISULFATE 75 MG PO TABS: 75.0000 mg | ORAL_TABLET | Freq: Once | ORAL | Status: DC

## 2019-10-08 MED ORDER — SODIUM CHLORIDE 0.9 % IV SOLN: INTRAVENOUS | Status: DC

## 2019-10-08 MED ORDER — LIDOCAINE 2% (20 MG/ML) 5 ML SYRINGE
INTRAMUSCULAR | Status: DC | PRN
  Administered 2019-10-08: 40 mg via INTRAVENOUS

## 2019-10-08 MED ORDER — ASPIRIN 325 MG PO TABS: 325.0000 mg | ORAL_TABLET | Freq: Once | ORAL | Status: DC

## 2019-10-08 MED ORDER — LACTATED RINGERS IV SOLN: INTRAVENOUS | Status: DC

## 2019-10-08 MED ORDER — ROCURONIUM BROMIDE 50 MG/5ML IV SOSY
PREFILLED_SYRINGE | INTRAVENOUS | Status: DC | PRN
  Administered 2019-10-08 (×3): 20 mg via INTRAVENOUS
  Administered 2019-10-08: 60 mg via INTRAVENOUS
  Administered 2019-10-08 (×2): 30 mg via INTRAVENOUS
  Administered 2019-10-08 (×2): 20 mg via INTRAVENOUS

## 2019-10-08 MED ORDER — EPHEDRINE SULFATE 50 MG/ML IJ SOLN
INTRAMUSCULAR | Status: DC | PRN
  Administered 2019-10-08: 5 mg via INTRAVENOUS

## 2019-10-08 MED ORDER — PANTOPRAZOLE SODIUM 40 MG IV SOLR
40.0000 mg | Freq: Every day | INTRAVENOUS | Status: DC
  Administered 2019-10-08 – 2019-10-11 (×4): 40 mg via INTRAVENOUS
  Filled 2019-10-08 (×4): qty 40

## 2019-10-08 MED ORDER — VANCOMYCIN HCL 1000 MG IV SOLR
INTRAVENOUS | Status: DC | PRN
  Administered 2019-10-08: 1000 mg via INTRAVENOUS

## 2019-10-08 MED ORDER — PHENYLEPHRINE HCL (PRESSORS) 10 MG/ML IV SOLN
INTRAVENOUS | Status: DC | PRN
  Administered 2019-10-08: 80 ug via INTRAVENOUS

## 2019-10-08 MED ORDER — MORPHINE SULFATE (PF) 2 MG/ML IV SOLN
INTRAVENOUS | Status: AC
  Administered 2019-10-08: 2 mg via INTRAVENOUS
  Filled 2019-10-08: qty 1

## 2019-10-08 MED ORDER — PROPOFOL 10 MG/ML IV BOLUS
INTRAVENOUS | Status: DC | PRN
  Administered 2019-10-08: 200 mg via INTRAVENOUS

## 2019-10-08 MED ORDER — ONDANSETRON HCL 4 MG PO TABS: 4.0000 mg | ORAL_TABLET | ORAL | Status: DC | PRN

## 2019-10-08 MED ORDER — IOHEXOL 300 MG/ML  SOLN
100.0000 mL | Freq: Once | INTRAMUSCULAR | Status: AC | PRN
  Administered 2019-10-08: 20 mL via INTRA_ARTERIAL

## 2019-10-08 MED ORDER — VANCOMYCIN HCL IN DEXTROSE 1-5 GM/200ML-% IV SOLN
1000.0000 mg | INTRAVENOUS | Status: AC
  Administered 2019-10-08: 1000 mg via INTRAVENOUS
  Filled 2019-10-08: qty 200

## 2019-10-08 MED ORDER — ONDANSETRON HCL 4 MG/2ML IJ SOLN
4.0000 mg | INTRAMUSCULAR | Status: DC | PRN
  Administered 2019-10-08 – 2019-10-12 (×4): 4 mg via INTRAVENOUS
  Filled 2019-10-08 (×4): qty 2

## 2019-10-08 MED ORDER — HYDROCODONE-ACETAMINOPHEN 5-325 MG PO TABS
1.0000 | ORAL_TABLET | ORAL | Status: DC | PRN
  Administered 2019-10-09 – 2019-10-11 (×4): 1 via ORAL
  Filled 2019-10-08 (×5): qty 1

## 2019-10-08 NOTE — Progress Notes (Signed)
Upon patient arrival, Left groin site Level 3, hematoma about size of a softball. Pressure held immediately. Bilateral pulses still intact. Dr. Conchita Paris paged and informed. PRN meds given, rn will continue to monitor and hold pressure

## 2019-10-08 NOTE — Anesthesia Procedure Notes (Addendum)
Procedure Name: Intubation Date/Time: 10/08/2019 1:21 PM Performed by: Lavell Luster, CRNA Pre-anesthesia Checklist: Patient identified, Emergency Drugs available, Suction available and Patient being monitored Patient Re-evaluated:Patient Re-evaluated prior to induction Oxygen Delivery Method: Circle System Utilized Preoxygenation: Pre-oxygenation with 100% oxygen Induction Type: IV induction Ventilation: Mask ventilation without difficulty Laryngoscope Size: Mac and 3 Grade View: Grade I Tube type: Oral Tube size: 7.0 mm Number of attempts: 1 Airway Equipment and Method: Stylet and Oral airway Placement Confirmation: ETT inserted through vocal cords under direct vision,  positive ETCO2 and breath sounds checked- equal and bilateral Secured at: 21 cm Tube secured with: Tape Dental Injury: Teeth and Oropharynx as per pre-operative assessment  Comments: [place] performed

## 2019-10-08 NOTE — Progress Notes (Signed)
  NEUROSURGERY PROGRESS NOTE   Pt seen in PACU immediately postop.   EXAM:  BP 124/82 (BP Location: Right Arm)   Pulse 93   Temp (!) 97 F (36.1 C)   Resp 18   Ht 5\' 4"  (1.626 m)   Wt 81.6 kg   LMP 09/18/2019 (Approximate)   SpO2 100%   BMI 30.90 kg/m   Drowsy but arouses easily Answers simple questions Following commands, MAE symmetrically Bilateral groin sheaths in place, soft  IMPRESSION:  34 y.o. female s/p Surpass Evolve embolization complicated by stent thrombus/malapposition. Appears to be neurologically intact  PLAN: - Flat in bed while sheaths in place - Cont Integrilin gtt @ dose of 67mcg/kg/min for total 12 hrs, started intraop around 3p

## 2019-10-08 NOTE — Anesthesia Procedure Notes (Deleted)
Arterial Line Insertion Start/End4/12/2019 1:20 PM, 10/08/2019 1:30 PM Performed by: Gaynelle Adu, MD, anesthesiologist  Patient location: OR. Preanesthetic checklist: patient identified, IV checked, site marked, risks and benefits discussed, surgical consent, monitors and equipment checked, pre-op evaluation, timeout performed and anesthesia consent Lidocaine 1% used for infiltration Left, brachial was placed Catheter size: 20 Fr Hand hygiene performed , maximum sterile barriers used  and Seldinger technique used  Attempts: 1 Procedure performed using ultrasound guided technique. Ultrasound Notes:anatomy identified, needle tip was noted to be adjacent to the nerve/plexus identified, no ultrasound evidence of intravascular and/or intraneural injection and image(s) printed for medical record Following insertion, dressing applied, Biopatch and line sutured. Post procedure assessment: normal and unchanged  Patient tolerated the procedure well with no immediate complications.

## 2019-10-08 NOTE — Progress Notes (Signed)
Report given to PACU RN.

## 2019-10-08 NOTE — Progress Notes (Signed)
Orthopedic Tech Progress Note Patient Details:  Alyssa Brooks [birth year] [id number]  Patient ID: Alyssa Brooks, female   DOB: September 16, 1985, 34 y.o.   MRN: [id number] I delivered a 5lb sand bag to rn at rn request.  Trinna Post 10/08/2019, 10:48 PM

## 2019-10-08 NOTE — Progress Notes (Addendum)
56F sheath left in place (left groin), site unremarkable

## 2019-10-08 NOTE — Transfer of Care (Signed)
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  Last Pain:  Vitals:   10/08/19 1110  TempSrc:   PainSc: 0-No pain      Patients Stated Pain Goal: 4 (10/08/19 1110)  Complications: No apparent anesthesia complications

## 2019-10-08 NOTE — Progress Notes (Signed)
Neurosurgery  Called by nurse regarding persistent bleeding around left femoral sheath despite holding pressure as well as hematoma at the brachial A-line site. I discussed the patient's care with Dr. Conchita Paris who advised discontinuing the Integrilin drip. Will continue to hold pressure on her groin for 2 hours and then attempt to place a Fem-stop on the leg. I examined the patient at bedside.  She continues to have good pulses in all 4 extremities.  No tachycardia or hypotension.  Will keep her comfortable with Fentanyl PRN as she is having significant discomfort from her groin pressure.

## 2019-10-08 NOTE — Progress Notes (Signed)
17F sheath left in place (right groin), site unremarkable

## 2019-10-08 NOTE — Anesthesia Postprocedure Evaluation (Signed)
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

## 2019-10-08 NOTE — Brief Op Note (Signed)
  NEUROSURGERY BRIEF OPERATIVE  NOTE   PREOP DX: Right carotid aneurysms  POSTOP DX: Same  PROCEDURE: Surpass Evolve embolization of RICA aneurysms  SURGEON: Dr. Lisbeth Renshaw, MD  ASSISTANT: Dr. Osker Mason (radiology)  ANESTHESIA: GETA  MEDICATIONS: 1. 7.3mg  Integrilin IA with IV infusion started intraoperatively  EBL: Minimal  SPECIMENS: None  COMPLICATIONS: None  CONDITION: Stable to recovery  FINDINGS (Full report in [facility]): 1. Succssful surpass evolve embolization of RICA aneurysms complicated by stent malaposition and in-stent thrombus. This required placement of Neuroform Atlas delivered from the contralateral circulation across the Acom.

## 2019-10-08 NOTE — Anesthesia Procedure Notes (Addendum)
Arterial Line Insertion Start/End4/12/2019 1:20 PM, 10/08/2019 1:30 PM Performed by: Gaynelle Adu, MD, anesthesiologist  Patient location: OR. Preanesthetic checklist: patient identified, IV checked, site marked, risks and benefits discussed, surgical consent, monitors and equipment checked, pre-op evaluation and timeout performed Lidocaine 1% used for infiltration and patient sedated Left, brachial was placed Catheter size: 20 G Hand hygiene performed , maximum sterile barriers used  and Seldinger technique used  Attempts: 1 Procedure performed using ultrasound guided technique. Ultrasound Notes:anatomy identified, needle tip was noted to be adjacent to the nerve/plexus identified, no ultrasound evidence of intravascular and/or intraneural injection and image(s) printed for medical record Following insertion, line sutured, Biopatch and dressing applied. Post procedure assessment: normal  Patient tolerated the procedure well with no immediate complications. Additional procedure comments: Attempted arterial line on left with no flash, pt was tearful and did not tolerate well. Dressing applied and pressure held.  Attempted x 2 on right side with good flow but when advancing wire pt complained of sharp pain.  Dr Sampson Goon at bedside and wanted to do brachial under U/S visualization after induction.  Dressing applied to right radial and pressure held.  Proceeded to IR suite.  Zigmund Gottron, CRNA.

## 2019-10-08 NOTE — Progress Notes (Signed)
27F sheath left in place, unremarkable site

## 2019-10-08 NOTE — Progress Notes (Signed)
RT called due to excessive bleeding around brachial arterial catheter. Upon arrival the decision was made it was no longer able for use, and was pulled out. Pressure was held until bleeding stopped. RT will continue to monitor PT.

## 2019-10-08 NOTE — Anesthesia Preprocedure Evaluation (Addendum)
Anesthesia Evaluation  Patient identified by MRN, date of birth, ID band Patient awake    Reviewed: Allergy & Precautions, H&P , NPO status , Patient's Chart, lab work & pertinent test results  Airway Mallampati: I  TM Distance: >3 FB Neck ROM: Full    Dental no notable dental hx. (+) Teeth Intact, Dental Advisory Given   Pulmonary neg pulmonary ROS, Current Smoker and Patient abstained from smoking.,    Pulmonary exam normal breath sounds clear to auscultation       Cardiovascular negative cardio ROS   Rhythm:Regular Rate:Normal     Neuro/Psych  Headaches, Bipolar Disorder    GI/Hepatic negative GI ROS, Neg liver ROS,   Endo/Other  negative endocrine ROS  Renal/GU negative Renal ROS  negative genitourinary   Musculoskeletal   Abdominal   Peds  Hematology negative hematology ROS (+)   Anesthesia Other Findings   Reproductive/Obstetrics negative OB ROS                            Anesthesia Physical Anesthesia Plan  ASA: II  Anesthesia Plan: General   Post-op Pain Management:    Induction: Intravenous  PONV Risk Score and Plan: 3 and Ondansetron, Dexamethasone and Midazolam  Airway Management Planned: Oral ETT  Additional Equipment: Arterial line  Intra-op Plan:   Post-operative Plan: Extubation in OR  Informed Consent: I have reviewed the patients History and Physical, chart, labs and discussed the procedure including the risks, benefits and alternatives for the proposed anesthesia with the patient or authorized representative who has indicated his/her understanding and acceptance.     Dental advisory given  Plan Discussed with: CRNA  Anesthesia Plan Comments:         Anesthesia Quick Evaluation

## 2019-10-08 NOTE — Progress Notes (Signed)
Neurosurgery MD paged to notify of patient still oozing at left groin site and at left brachial a line site. MD en route to see patient. Still holding pressure at left groin site and now left brachial site.

## 2019-10-09 ENCOUNTER — Inpatient Hospital Stay (HOSPITAL_COMMUNITY): Payer: BC Managed Care – PPO

## 2019-10-09 ENCOUNTER — Encounter: Payer: Self-pay | Admitting: *Deleted

## 2019-10-09 DIAGNOSIS — R4182 Altered mental status, unspecified: Secondary | ICD-10-CM

## 2019-10-09 HISTORY — PX: IR PATIENT EVAL TECH 0-60 MINS: IMG5564

## 2019-10-09 LAB — BASIC METABOLIC PANEL
Anion gap: 8 (ref 5–15)
BUN: 6 mg/dL (ref 6–20)
CO2: 19 mmol/L — ABNORMAL LOW (ref 22–32)
Calcium: 7.7 mg/dL — ABNORMAL LOW (ref 8.9–10.3)
Chloride: 111 mmol/L (ref 98–111)
Creatinine, Ser: 0.7 mg/dL (ref 0.44–1.00)
GFR calc Af Amer: >60 mL/min (ref >60)
GFR calc non Af Amer: >60 mL/min (ref >60)
Glucose, Bld: 114 mg/dL — ABNORMAL HIGH (ref 70–99)
Potassium: 4 mmol/L (ref 3.5–5.1)
Sodium: 138 mmol/L (ref 135–145)

## 2019-10-09 LAB — POCT ACTIVATED CLOTTING TIME: Activated Clotting Time: 202 seconds

## 2019-10-09 LAB — MRSA PCR SCREENING: MRSA by PCR: NEGATIVE

## 2019-10-09 LAB — HEMOGLOBIN AND HEMATOCRIT, BLOOD
HCT: 30.4 % — ABNORMAL LOW (ref 36.0–46.0)
Hemoglobin: 9.7 g/dL — ABNORMAL LOW (ref 12.0–15.0)

## 2019-10-09 MED ORDER — NIMODIPINE 30 MG PO CAPS
60.0000 mg | ORAL_CAPSULE | ORAL | Status: DC
  Administered 2019-10-09 – 2019-10-12 (×17): 60 mg via ORAL
  Filled 2019-10-09 (×17): qty 2

## 2019-10-09 MED ORDER — NIMODIPINE 6 MG/ML PO SOLN: 60.0000 mg | ORAL | Status: DC

## 2019-10-09 MED ORDER — ASPIRIN 300 MG RE SUPP: 300.0000 mg | Freq: Every day | RECTAL | Status: DC

## 2019-10-09 MED ORDER — LABETALOL HCL 5 MG/ML IV SOLN: 5.0000 mg | INTRAVENOUS | Status: DC | PRN

## 2019-10-09 MED ORDER — LORAZEPAM 2 MG/ML IJ SOLN
2.0000 mg | Freq: Once | INTRAMUSCULAR | Status: AC
  Administered 2019-10-09: 2 mg via INTRAVENOUS

## 2019-10-09 MED ORDER — METOCLOPRAMIDE HCL 5 MG/ML IJ SOLN
5.0000 mg | Freq: Four times a day (QID) | INTRAMUSCULAR | Status: DC | PRN
  Administered 2019-10-09 (×2): 5 mg via INTRAVENOUS
  Filled 2019-10-09 (×2): qty 2

## 2019-10-09 MED ORDER — LEVETIRACETAM IN NACL 500 MG/100ML IV SOLN
500.0000 mg | Freq: Two times a day (BID) | INTRAVENOUS | Status: DC
  Administered 2019-10-10 – 2019-10-11 (×5): 500 mg via INTRAVENOUS
  Filled 2019-10-09 (×5): qty 100

## 2019-10-09 MED ORDER — LORAZEPAM 2 MG/ML IJ SOLN
INTRAMUSCULAR | Status: AC
  Administered 2019-10-09: 2 mg via INTRAVENOUS
  Filled 2019-10-09: qty 1

## 2019-10-09 MED ORDER — SODIUM CHLORIDE 0.9 % IV BOLUS
1000.0000 mL | Freq: Once | INTRAVENOUS | Status: AC
  Administered 2019-10-09: 1000 mL via INTRAVENOUS

## 2019-10-09 MED ORDER — SODIUM CHLORIDE 0.9 % IV SOLN: 250.0000 mL | INTRAVENOUS | Status: DC

## 2019-10-09 MED ORDER — LABETALOL HCL 5 MG/ML IV SOLN
5.0000 mg | INTRAVENOUS | Status: DC | PRN
  Administered 2019-10-09: 10 mg via INTRAVENOUS
  Filled 2019-10-09: qty 4

## 2019-10-09 MED ORDER — CHLORHEXIDINE GLUCONATE CLOTH 2 % EX PADS
6.0000 | MEDICATED_PAD | Freq: Every day | CUTANEOUS | Status: DC
  Administered 2019-10-10 – 2019-10-11 (×2): 6 via TOPICAL

## 2019-10-09 MED ORDER — ASPIRIN 325 MG PO TABS
325.0000 mg | ORAL_TABLET | Freq: Every day | ORAL | Status: DC
  Administered 2019-10-09 – 2019-10-12 (×4): 325 mg via ORAL
  Filled 2019-10-09 (×4): qty 1

## 2019-10-09 MED ORDER — PHENYLEPHRINE HCL-NACL 10-0.9 MG/250ML-% IV SOLN
0.0000 ug/min | INTRAVENOUS | Status: DC
  Administered 2019-10-09 (×2): 200 ug/min via INTRAVENOUS
  Administered 2019-10-09: 190 ug/min via INTRAVENOUS
  Administered 2019-10-09 (×5): 200 ug/min via INTRAVENOUS
  Administered 2019-10-09: 25 ug/min via INTRAVENOUS
  Administered 2019-10-09 (×2): 200 ug/min via INTRAVENOUS
  Administered 2019-10-10: 160 ug/min via INTRAVENOUS
  Administered 2019-10-10: 180 ug/min via INTRAVENOUS
  Administered 2019-10-10 (×3): 200 ug/min via INTRAVENOUS
  Administered 2019-10-10: 13:00:00 180 ug/min via INTRAVENOUS
  Administered 2019-10-10: 02:00:00 200 ug/min via INTRAVENOUS
  Administered 2019-10-10: 150 ug/min via INTRAVENOUS
  Administered 2019-10-10 (×2): 200 ug/min via INTRAVENOUS
  Administered 2019-10-10: 190 ug/min via INTRAVENOUS
  Filled 2019-10-09: qty 750
  Filled 2019-10-09 (×2): qty 250
  Filled 2019-10-09: qty 750
  Filled 2019-10-09: qty 250
  Filled 2019-10-09: qty 750
  Filled 2019-10-09: qty 500
  Filled 2019-10-09: qty 250
  Filled 2019-10-09: qty 750
  Filled 2019-10-09: qty 1000
  Filled 2019-10-09 (×3): qty 250
  Filled 2019-10-09: qty 500
  Filled 2019-10-09: qty 250

## 2019-10-09 MED ORDER — IOHEXOL 350 MG/ML SOLN
75.0000 mL | Freq: Once | INTRAVENOUS | Status: AC | PRN
  Administered 2019-10-09: 75 mL via INTRAVENOUS

## 2019-10-09 NOTE — Progress Notes (Signed)
  NEUROSURGERY PROGRESS NOTE   Received call from nursing about new onset bradycardia. Patient stable neurologically. They ran single cardiac strip which was significant for AV block. Stat ECG ordered which confirmed second degree AV block. Patient's chart reviewed including records from [facility]. Patient did have ECG in 06/2019 at [facility]. 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 EKG stable. No new orders. Continue to monitor.

## 2019-10-09 NOTE — Progress Notes (Signed)
Neurosurgery MD notified of patient left brachial site hematoma growing even larger. Pulses were +3 and now +1, no numbness or tingling per patient. MD informed no further treatment at this time as long as she still have palpable pulses and that it will take hours for Integrelin to wear off in her system to stop the bleeding.

## 2019-10-09 NOTE — Procedures (Signed)
8 fr right femoral sheath removed by Kristin Bruins RT R  at 1002 am using 7 fr exoseal and quick clot.  Pressure held for 30 minutes. Site assessed, quick clot removed, and gauze and tegaderm applied. Site reviewed by RN Scarlette Slice. 5 fr left femoral sheath removed by Pearlean Brownie RT R CV at 1018 using 5 fr exoseal and quick clot. Pressure held for 15 minutes.  Note of extensive hematoma already at left site prior to removal of sheath.  Site soft to touch post sheath removal. Quick clot remains in place on left groin access site to be removed at 1045 on 10/10/19. Site reviewed with Scarlette Slice RN.  Distal pulses intact.

## 2019-10-09 NOTE — Progress Notes (Signed)
Md called to recheck on patient neuro status. States he does not want to risk taking patient for CT due to chances of bleeding starting again and that if the CT was to show anything, then we would not be changing the course of treatment at this point. Patient neuro status remains different than baseline. RN will continue to monitor.

## 2019-10-09 NOTE — Progress Notes (Signed)
Spoke with neurosurgeon on call in regards to patient status. Primary nurse caring for patient, Odette Horns, recently spoke with MD and updated him that patient has vomited multiple times with last emesis appearing to be dark brown, potentially bloody. This nurse called MD after primary nurse spoke with him. This nurse informed MD that we are not able to get patient to follow commands at this time. Patient affect very different from her assessment at the beginning of the shift. Patient repeats "yes" when asked to follow commands and when asked questions, but will not follow commands. Questioned if we should obtain head CT at this time. Per MD no further orders and no CT at this time. MD instructed staff to continue to monitor patient. Patient began following commands shortly after speaking with MD; however, patient neuro status still changed in comparison to neuro status at time of arrival to unit.

## 2019-10-09 NOTE — Progress Notes (Addendum)
Patient following commands and oriented to self and somewhat location. After assessment and I left the room, pt heart rate was 180's, sweating, and not following commands. She was thrashing in the bed and unable to answer any questions.   Called on-call neuro sx and gave 2mg  of ativan, .25 of fentanyl and stat head CT.  New orders for keppra and EEG

## 2019-10-09 NOTE — Progress Notes (Signed)
EEG complete - results pending 

## 2019-10-09 NOTE — Progress Notes (Signed)
RN paged MD to notify patient projectile vomiting for the second time despite antiemetics given, and patient's neuro change. MD gave verbal order for reglan and does not want to take patient for CT at this time "because there will be no change in treatment". Patient neuro status is different than 0030. Patient not as responsive and intermittently following commands. RN will continue to monitor.

## 2019-10-09 NOTE — Progress Notes (Signed)
Urine output steady increasing throughout the day- foley last emptied at 1800 with 900 & at 1900 she had 1000. Checked an urine specific gravity (1.0052). Paged neurosx - new orders for 1L normal saline bolus, BMET and to recheck specific gravy in 3 hrs.

## 2019-10-09 NOTE — Progress Notes (Signed)
  NEUROSURGERY PROGRESS NOTE   Events of last night/am reviewed. Left groin hematoma/left brachial hematoma required d/c of Integrilin around 8p. Apparent change in MS required CT/CTA this am.  EXAM:  BP 137/84   Pulse 86   Temp 97.9 F (36.6 C) (Axillary)   Resp 18   Ht 5\' 4"  (1.626 m)   Wt 81.6 kg   LMP 09/18/2019 (Approximate)   SpO2 100%   BMI 30.90 kg/m   Awake, alert Perseverates when questioned Follows simple commands, squeezes both hands, moves both legs Left groin hematoma soft, right groin normal Left brachial hematoma soft 2+ pulses left radial, bilateral DP  IMAGING: CT/CTA reviewed personally and with neuroradiology. There is small volume SAH in the right sylvian fissure and interpeduncular cistern. There is notable spasm involving primarily the left A1 and likely bilateral distal ACA segments. ?early left frontal infarction.  IMPRESSION: -  34 y.o. female POD#1 s/p Surpass embolization RICA aneurysms requiring placement of Neuroform stent from the contralateral circulation across the Acom. This is the likely reason for the left ACA spasm.  - Groin/arm hematomas appear soft with good distal pulses.  PLAN: - will d/c bilateral femoral sheaths today - Allow SBP 160-155mmHg, Neo if necessary - At this point pt unable to swallow, may need cortrak for meds esp ASA/Plavix - Start nimotop - Will get TCD for monitoring

## 2019-10-09 NOTE — Progress Notes (Signed)
CORTRAK NOTE   Cortrak team consulted for Cortrak placement. This RD was able to talk with RN this AM who indicated that patient would need a Cortrak for meds as she was felt to be unsafe to swallow given mental status changes overnight, not following commands.   RD attempted to place tube x2 but patient became very agitated, moving all extremities and head during both attempts. The decision was made by several RNs at bedside to attempt medications in applesauce. Patient was able to consume all meds in applesauce with several prompts/encouragement with each pill.   Cortrak tube was left at bedside and this RD informed RN to reach out should tube be needed/warranted later in the day.   Will discontinue Cortrak consult at this time. If things change, Cortrak service will be available again on Friday (4/9).    Trenton Gammon, MS, RD, LDN, CNSC Inpatient Clinical Dietitian RD pager # available in [facility]  After hours/weekend pager # available in [facility]

## 2019-10-09 NOTE — Progress Notes (Signed)
TCD       has been completed. Preliminary results can be found under CV proc through chart review. Torin Whisner, BS, RDMS, RVT   

## 2019-10-09 NOTE — Progress Notes (Signed)
MD Kathyrn Sheriff called to make aware that Neo gtt is maxed and SBP goal is not met. No other orders provided at this time, will continue to monitor on Neo gtt.

## 2019-10-09 NOTE — Progress Notes (Addendum)
  NEUROSURGERY PROGRESS NOTE   Multiple issues overnight including changes in mental status, brachial and L femoral site hematomas. Notified this am regarding changes in mental status. Stat CTA ordered. I came by to immediately this am to evaluate the patient.  EXAM:  BP 127/81   Pulse 85   Temp 97.9 F (36.6 C) (Axillary)   Resp 17   Ht 5\' 4"  (1.626 m)   Wt 81.6 kg   LMP 09/18/2019 (Approximate)   SpO2 100%   BMI 30.90 kg/m   Drowsy, easily awakened. Difficulties staying awake. Did just get fentanyl for CTA Oriented to self, but not location (says [facility]) and year (says DOB) Does follow commands with relatively equal strength CN grossly intact L femoral hematoma. Good distal pulses No R femoral hematoma L brachial hematoma. Good distal pulse  IMPRESSION/PLAN 34 y.o. female POD#1 surpass evolve embolization of RICA aneurysms, complicated intra op by stent malposition and in-stent thrombus. Noted change in mental status as above since this am. - STAT CTA head ordered. Dr 31 notified. Call report by Dr Conchita Paris: small amount of hemorrhage, vasospasm, ?early infarction left frontal lobe, no hydrocephalus. - start nimotop, BP goal 160-170. Increased fluids. Started neo. - Bedside swallow. If fails, need cortrak to take ASA and Plavix.

## 2019-10-10 ENCOUNTER — Inpatient Hospital Stay: Payer: Self-pay

## 2019-10-10 DIAGNOSIS — R001 Bradycardia, unspecified: Secondary | ICD-10-CM

## 2019-10-10 LAB — BASIC METABOLIC PANEL
Anion gap: 12 (ref 5–15)
Anion gap: 9 (ref 5–15)
BUN: <5 mg/dL — ABNORMAL LOW (ref 6–20)
BUN: <5 mg/dL — ABNORMAL LOW (ref 6–20)
CO2: 18 mmol/L — ABNORMAL LOW (ref 22–32)
CO2: 19 mmol/L — ABNORMAL LOW (ref 22–32)
Calcium: 8.1 mg/dL — ABNORMAL LOW (ref 8.9–10.3)
Calcium: 7.9 mg/dL — ABNORMAL LOW (ref 8.9–10.3)
Chloride: 111 mmol/L (ref 98–111)
Chloride: 113 mmol/L — ABNORMAL HIGH (ref 98–111)
Creatinine, Ser: 0.66 mg/dL (ref 0.44–1.00)
Creatinine, Ser: 0.66 mg/dL (ref 0.44–1.00)
GFR calc Af Amer: >60 mL/min (ref >60)
GFR calc Af Amer: >60 mL/min (ref >60)
GFR calc non Af Amer: >60 mL/min (ref >60)
GFR calc non Af Amer: >60 mL/min (ref >60)
Glucose, Bld: 93 mg/dL (ref 70–99)
Glucose, Bld: 97 mg/dL (ref 70–99)
Potassium: 3.7 mmol/L (ref 3.5–5.1)
Potassium: 3.3 mmol/L — ABNORMAL LOW (ref 3.5–5.1)
Sodium: 141 mmol/L (ref 135–145)
Sodium: 141 mmol/L (ref 135–145)

## 2019-10-10 LAB — BASIC METABOLIC PANEL WITH GFR
Anion gap: 9 (ref 5–15)
BUN: <5 mg/dL — ABNORMAL LOW (ref 6–20)
CO2: 19 mmol/L — ABNORMAL LOW (ref 22–32)
Calcium: 7.7 mg/dL — ABNORMAL LOW (ref 8.9–10.3)
Chloride: 113 mmol/L — ABNORMAL HIGH (ref 98–111)
Creatinine, Ser: 0.66 mg/dL (ref 0.44–1.00)
GFR calc Af Amer: >60 mL/min (ref >60)
GFR calc non Af Amer: >60 mL/min (ref >60)
Glucose, Bld: 101 mg/dL — ABNORMAL HIGH (ref 70–99)
Potassium: 3.6 mmol/L (ref 3.5–5.1)
Sodium: 141 mmol/L (ref 135–145)

## 2019-10-10 LAB — CBC
HCT: 25.3 % — ABNORMAL LOW (ref 36.0–46.0)
Hemoglobin: 8.3 g/dL — ABNORMAL LOW (ref 12.0–15.0)
MCH: 28.3 pg (ref 26.0–34.0)
MCHC: 32.8 g/dL (ref 30.0–36.0)
MCV: 86.3 fL (ref 80.0–100.0)
Platelets: 240 10*3/uL (ref 150–400)
RBC: 2.93 MIL/uL — ABNORMAL LOW (ref 3.87–5.11)
RDW: 14.6 % (ref 11.5–15.5)
WBC: 12.2 10*3/uL — ABNORMAL HIGH (ref 4.0–10.5)
nRBC: 0 % (ref 0.0–0.2)

## 2019-10-10 LAB — PHOSPHORUS: Phosphorus: 2.1 mg/dL — ABNORMAL LOW (ref 2.5–4.6)

## 2019-10-10 LAB — OSMOLALITY, URINE: Osmolality, Ur: 288 mOsm/kg — ABNORMAL LOW (ref 300–900)

## 2019-10-10 LAB — MAGNESIUM: Magnesium: 1.7 mg/dL (ref 1.7–2.4)

## 2019-10-10 LAB — OSMOLALITY: Osmolality: 290 mOsm/kg (ref 275–295)

## 2019-10-10 MED ORDER — LORAZEPAM 2 MG/ML IJ SOLN: 2.0000 mg | Freq: Once | INTRAMUSCULAR | Status: AC

## 2019-10-10 MED ORDER — SODIUM CHLORIDE 0.9% FLUSH: 10.0000 mL | INTRAVENOUS | Status: DC | PRN

## 2019-10-10 MED ORDER — SODIUM CHLORIDE 0.9 % IV BOLUS
500.0000 mL | Freq: Once | INTRAVENOUS | Status: AC
  Administered 2019-10-10: 11:00:00 500 mL via INTRAVENOUS

## 2019-10-10 MED ORDER — LACTATED RINGERS IV SOLN: INTRAVENOUS | Status: DC

## 2019-10-10 MED ORDER — SODIUM CHLORIDE 0.9% FLUSH
10.0000 mL | Freq: Two times a day (BID) | INTRAVENOUS | Status: DC
  Administered 2019-10-10: 22:00:00 10 mL
  Administered 2019-10-10: 15:00:00 20 mL
  Administered 2019-10-11 (×2): 10 mL

## 2019-10-10 MED ORDER — LORAZEPAM 2 MG/ML IJ SOLN
INTRAMUSCULAR | Status: AC
  Administered 2019-10-10: 2 mg via INTRAVENOUS
  Filled 2019-10-10: qty 1

## 2019-10-10 MED ORDER — POTASSIUM CHLORIDE 10 MEQ/50ML IV SOLN
10.0000 meq | INTRAVENOUS | Status: AC
  Administered 2019-10-10 (×2): 10 meq via INTRAVENOUS
  Filled 2019-10-10 (×2): qty 50

## 2019-10-10 MED ORDER — PHENYLEPHRINE CONCENTRATED 100MG/250ML (0.4 MG/ML) INFUSION SIMPLE
0.0000 ug/min | INTRAVENOUS | Status: DC
  Administered 2019-10-10: 160 ug/min via INTRAVENOUS
  Administered 2019-10-11: 190 ug/min via INTRAVENOUS
  Filled 2019-10-10 (×4): qty 250

## 2019-10-10 MED ORDER — NOREPINEPHRINE 4 MG/250ML-% IV SOLN
0.0000 ug/min | INTRAVENOUS | Status: DC
  Administered 2019-10-11: 10 ug/min via INTRAVENOUS
  Filled 2019-10-10: qty 250

## 2019-10-10 MED ORDER — SODIUM CHLORIDE 0.9 % IV BOLUS
1000.0000 mL | Freq: Once | INTRAVENOUS | Status: AC
  Administered 2019-10-10: 1000 mL via INTRAVENOUS

## 2019-10-10 MED ORDER — SODIUM CHLORIDE 0.9 % IV BOLUS
500.0000 mL | Freq: Once | INTRAVENOUS | Status: AC
  Administered 2019-10-10: 14:00:00 500 mL via INTRAVENOUS

## 2019-10-10 MED ORDER — SODIUM CHLORIDE 0.9 % IV SOLN
2.0000 ug | Freq: Once | INTRAVENOUS | Status: AC
  Administered 2019-10-10: 2 ug via INTRAVENOUS
  Filled 2019-10-10: qty 0.5

## 2019-10-10 NOTE — Progress Notes (Signed)
Peripherally Inserted Central Catheter Placement  The IV Nurse has discussed with the patient and/or persons authorized to consent for the patient, the purpose of this procedure and the potential benefits and risks involved with this procedure.  The benefits include less needle sticks, lab draws from the catheter, and the patient may be discharged home with the catheter. Risks include, but not limited to, infection, bleeding, blood clot (thrombus formation), and puncture of an artery; nerve damage and irregular heartbeat and possibility to perform a PICC exchange if needed/ordered by physician.  Alternatives to this procedure were also discussed.  Bard Power PICC patient education guide, fact sheet on infection prevention and patient information card has been provided to patient /or left at bedside.    PICC Placement Documentation  PICC Double Lumen 10/10/19 PICC Right Brachial 33 cm 0 cm (Active)  Indication for Insertion or Continuance of Line Vasoactive infusions 10/10/19 1211  Exposed Catheter (cm) 0 cm 10/10/19 1211  Site Assessment Clean;Dry;Intact 10/10/19 1211  Lumen #1 Status Flushed;Blood return noted 10/10/19 1211  Lumen #2 Status Flushed;Blood return noted 10/10/19 1211  Dressing Type Transparent 10/10/19 1211  Dressing Status Clean;Dry;Intact;Antimicrobial disc in place;Other (Comment) 10/10/19 1211  Dressing Intervention New dressing 10/10/19 1211  Dressing Change Due 10/17/19 10/10/19 1211   Mother at bedside signed consent    Maximino Greenland 10/10/2019, 12:12 PM

## 2019-10-10 NOTE — Progress Notes (Signed)
eLink Physician-Brief Progress Note Patient Name: Alyssa Brooks DOB: 25-Oct-1985 MRN: [id number]   Date of Service  10/10/2019  HPI/Events of Note  Polyuria - Urine output 6850 for this shift. 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   eICU Interventions  Will order: 1. BMP, Serum Osmolality and Urine Osmolality.         Dahmir Epperly Dennard Nip 10/10/2019, 4:34 AM

## 2019-10-10 NOTE — Procedures (Addendum)
Patient Name: Alyssa Brooks  MRN: [id number]  Epilepsy Attending: Charlsie Quest  Referring Physician/Provider: Dr Verlin Dike Date: 10/10/2019 Duration: 22.02 mins  Patient history: 34 yo F POD#1 s/p Surpass embolization RICA aneurysms with persistent ams. EEG to evaluate for seizure  Level of alertness: awake/lethargic  AEDs during EEG study: LEV  Technical aspects: This EEG study was done with scalp electrodes positioned according to the 10-20 International system of electrode placement. Electrical activity was acquired at a sampling rate of 500Hz  and reviewed with a high frequency filter of 70Hz  and a low frequency filter of 1Hz . EEG data were recorded continuously and digitally stored.   DESCRIPTION: No clear posterior dominant rhythm was seen. EEG showed continuous generalized and lateralized left hemisphere 3-6hz  theta-delta slowing.       Hyperventilation and photic stimulation were not performed.  ABNORMALITY - Continuous slow, generalized and lateralized left hemisphere  IMPRESSION: This study is suggestive of cortical dysfunction in left hemisphere likely secondary to underlying infarct as well as moderate diffuse encephalopathy, non specific to etiology. No seizures or epileptiform discharges were seen throughout the recording.  Alyssa Brooks 

## 2019-10-10 NOTE — Progress Notes (Signed)
eLink Physician-Brief Progress Note Patient Name: Alyssa Brooks DOB: Jun 15, 1986 MRN: [id number]   Date of Service  10/10/2019  HPI/Events of Note  Pt with large volume diuresis secondary to DI  eICU Interventions  Fluid replacement protocol ordered, BMP, Mg, PHOS Q 4 hours with replacement PRN        Thomasene Lot Terez Freimark 10/10/2019, 9:45 PM

## 2019-10-10 NOTE — Progress Notes (Signed)
  NEUROSURGERY PROGRESS NOTE   Events of last night reviewed. PCCM consulted Patient seen this am Complains of "pain" but unable to state where.  EXAM:  BP (!) 101/51   Pulse 83   Temp 98.8 F (37.1 C) (Oral)   Resp 19   Ht 5\' 4"  (1.626 m)   Wt 81.6 kg   LMP 09/18/2019 (Approximate)   SpO2 100%   BMI 30.90 kg/m   Awake, alert, oriented to self, location, but not year (says 1985) Speech slow but mostly appropriate CN appear grossly intact MAEW, seemingly nonfocal exam L brachial and femoral hematomas noted. Soft, appear to be improving. Good distal pulses.  TCDs reviewed to serve as baseline.  IMPRESSION/PLAN 34 y.o. female POD#2 surpass evolve embolization of RICA aneurysms, complicated intra op by stent malposition and in-stent thrombus. Post op complicated by SAH, vasospasm, left frontal infarct, no hydrocephalus. Increased UOP overnight, tachycardia - Continue nimotop, TCDs, SBP goal 160-170 - continue plavix/ASA - UOP: continue to monitor output, labs. - PCCM consulted. Appreciate assistance.

## 2019-10-10 NOTE — Progress Notes (Signed)
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 Vassie Loll MD

## 2019-10-10 NOTE — Progress Notes (Addendum)
Pt has become increasingly agitated throughout the afternoon, now verbally aggressive and pulling off equipment threatening to leave.  Neurosurg notified, 1 x dose of 2 mg IV Ativan ordered and administered.  Pt now resting.  BP has intermittently been below target of SBP 160 despite admin of max dose of Phenylephrine.  Neurosurg notified, and instructed to be permissive of this as long as her neuro exam is unchanged (which it has remained). Will switch to Levo per CCM once the bag of phenylephrine runs dry.  A total of 3 L of NS has been administered (as boluses in addition to her maintenance fluids) throughout the day intermittently to offset the marked difference between her intake and output.  UOP starting to slow down in late evening and urine specific gravity now WNL (1.006).

## 2019-10-10 NOTE — Progress Notes (Signed)
  NEUROSURGERY PROGRESS NOTE   No issues overnight. Pt c/o pain to nursing staff but tells me she is ok. Unable to say what exactly is bothering her.  EXAM:  BP (!) 132/120   Pulse 94   Temp 99.1 F (37.3 C)   Resp 19   Ht 5\' 4"  (1.626 m)   Wt 81.6 kg   LMP 09/18/2019 (Approximate)   SpO2 100%   BMI 30.90 kg/m   Awake, alert, oriented to person, year with prompting, president. Aware of reason for hospitalization. Speech fluent, appropriate  CN grossly intact  5/5 BUE/BLE   IMAGING: CTH last pm reviewed, largely unchanged from prior.  IMPRESSION: - 34 y.o. female POD# 2 s/p Surpass embolization of RICA aneurysms. Appears to be improving. - Increased UO, likely DI. Serum Na remains stable - EEG negative for SZ  PLAN: - OOB with therapy or nursing staff - Can give ddAVP to decrease UO - If TCD tomorrow is stable will titrate Neo off and monitor neurologic exam

## 2019-10-10 NOTE — Consult Note (Signed)
NAME:  Alyssa Brooks, MRN:  [id number], DOB:  July 23, 1985, LOS: 2 ADMISSION DATE:  10/08/2019, CONSULTATION DATE: 10/10/2019 REFERRING MD: Cato Mulligan, CHIEF COMPLAINT: Tachycardia  Brief History   Patient is a 34 year old female status post status post surpass devolve embolization of R ICA aneurysms with intermittent bradycardia and tachycardia  History of present illness   Patient is a 34 year old who on 10/08/2019 underwent elective surpass involved embolization of R ICA aneurysms.  She has had intermittent episodes of bradycardia last night having a transient episode of Mobitz type I.  Asked to see today after a transient episode of sinus tachycardia up to 180.  At the time of my evaluation the patient has a systolic of 185 and a pulse of 61 resting quietly.  She is easily arousable but is disoriented as to place and time.  Currently she is not combative.  Repeat CT of the brain done this evening shows slight increase in edema but otherwise no significant change. Patient has had large volume output of about 5 L over the course of the day and seems to be behind approximately a liter.  Recent specific gravity was 1.1.  She just received a 1 L bolus which may have helped with her tachycardia and blood pressure.  Currently she is on maximum peripheral IV dose of Neo-Synephrine.  She is on aspirin and Plavix.  She has had a bradycardic episode this seems to be related to Zofran.  Due to patient's degree of disorientation intermittent combativeness and anticoagulation placement of a central line if necessary will be difficult.  Past Medical History   . Bipolar 1 disorder ([place])   . Headache      Significant Hospital Events   Right ICA embolization as noted above.  Consults:  PCCM  Procedures:  As above  Significant Diagnostic Tests:  Real CT scans of the brain  Micro Data:  NA  Antimicrobials:  NA  Interim history/subjective:  NA  Objective   Blood pressure 124/80, pulse 78,  temperature 98.7 F (37.1 C), temperature source Oral, resp. rate 15, height 5\' 4"  (1.626 m), weight 81.6 kg, last menstrual period 09/18/2019, SpO2 100 %.        Intake/Output Summary (Last 24 hours) at 10/10/2019 0036 Last data filed at 10/09/2019 2345 Gross per 24 hour  Intake 6064.53 ml  Output 5850 ml  Net 214.53 ml   Filed Weights   10/08/19 1101  Weight: 81.6 kg    Examination: General: Black female sleeping easily arousable HENT: Within normal limits Lungs: Clear Cardiovascular: Regular Abdomen: Benign Extremities: Within normal limits Neuro: Nonfocal, disoriented x3 GU: Within normal limits  Resolved Hospital Problem list   NA  Assessment & Plan:  1.  Status post embolization of R ICA: Current therapy per neurosurgery  2.  Intermittent bouts of bradycardia and tachycardia: No real clear explanation as to why this is occurring and less it is neurologic in origin.  3.  Large volume urine output: Urine sodium currently is pending.  Specific gravity is actually up on most recent specimen.  It seems that she is probably a liter behind on her input, will try to volume expand over the next few hours.  4.  Intermittent hypotension: Patient currently on maximum dose of Neo-Synephrine.  Central line placement will be difficult due to combativeness when stimulated.  Critical care team will review case in the morning.  Best practice:  Diet: Continue current Pain/Anxiety/Delirium protocol (if indicated): Intermittent Precedex VAP protocol (if indicated): N/A DVT prophylaxis:  Per neurosurgery GI prophylaxis: N/A Glucose control: NA Mobility: Bedrest Code Status: Full Family Communication: Not available Disposition:   Labs   CBC: Recent Labs  Lab 10/04/19 1157 10/08/19 2153 10/09/19 0115  WBC 7.6  --   --   NEUTROABS 3.6  --   --   HGB 12.2 9.7* 9.7*  HCT 37.8 30.3* 30.4*  MCV 87.9  --   --   PLT 307  --   --     Basic Metabolic Panel: Recent Labs  Lab  10/04/19 1157 10/09/19 2101  NA 139 138  K 4.1 4.0  CL 106 111  CO2 23 19*  GLUCOSE 99 114*  BUN 9 6  CREATININE 0.78 0.70  CALCIUM 8.8* 7.7*   GFR: Estimated Creatinine Clearance: 100.5 mL/min (by C-G formula based on SCr of 0.7 mg/dL). Recent Labs  Lab 10/04/19 1157  WBC 7.6    Liver Function Tests: No results for input(s): AST, ALT, ALKPHOS, BILITOT, PROT, ALBUMIN in the last 168 hours. No results for input(s): LIPASE, AMYLASE in the last 168 hours. No results for input(s): AMMONIA in the last 168 hours.  ABG    Component Value Date/Time   TCO2 25 07/04/2008 1652     Coagulation Profile: Recent Labs  Lab 10/04/19 1157  INR 1.1    Cardiac Enzymes: No results for input(s): CKTOTAL, CKMB, CKMBINDEX, TROPONINI in the last 168 hours.  HbA1C: No results found for: HGBA1C  CBG: No results for input(s): GLUCAP in the last 168 hours.  Review of Systems:   Unable to obtain due to disorientation  Past Medical History  She,  has a past medical history of Bipolar 1 disorder (HCC) and Headache.   Surgical History    Past Surgical History:  Procedure Laterality Date  . BREAST CYST EXCISION    . IR ANGIO INTRA EXTRACRAN SEL INTERNAL CAROTID BILAT MOD SED  08/02/2019  . IR ANGIO VERTEBRAL SEL VERTEBRAL UNI R MOD SED  08/02/2019  . IR PATIENT EVAL TECH 0-60 MINS  10/09/2019  . RADIOLOGY WITH ANESTHESIA Right 10/08/2019   Procedure: Surpass Evolve embolization of right carotid aneurysm;  Surgeon: Lisbeth Renshaw, MD;  Location: [facility] OR;  Service: Radiology;  Laterality: Right;  . TUBAL LIGATION       Social History   reports that she has been smoking cigarettes. She has been smoking about 0.50 packs per day. She has never used smokeless tobacco. She reports current alcohol use. She reports current drug use. Frequency: 7.00 times per week. Drug: Marijuana.   Family History   Her family history includes Cancer in her maternal grandmother and mother; Diabetes in her  maternal grandfather; Hypertension in her maternal grandfather.   Allergies Allergies  Allergen Reactions  . Adhesive [Tape]     Paper tape pulls off skin, regular adhesive tape is fine  . Keflex [Cephalexin Monohydrate] Diarrhea and Nausea And Vomiting  . Penicillins Nausea And Vomiting    Did it involve swelling of the face/tongue/throat, SOB, or low BP? No Did it involve sudden or severe rash/hives, skin peeling, or any reaction on the inside of your mouth or nose? No Did you need to seek medical attention at a hospital or doctor's office? No When did it last happen?3 + years If all above answers are "NO", may proceed with cephalosporin use.      Home Medications  Prior to Admission medications   Medication Sig Start Date End Date Taking? Authorizing Provider  Aspirin-Acetaminophen-Caffeine (GOODY HEADACHE PO)  Take 1 packet by mouth daily as needed (headaches).   Yes [provider]  clopidogrel (PLAVIX) 75 MG tablet Take 75 mg by mouth daily.   Yes [provider]  aspirin 325 MG tablet Take 325 mg by mouth daily.    [provider]     Critical care time: Over 35 minutes spent in bedside evaluation chart review critical care planning

## 2019-10-10 NOTE — Progress Notes (Addendum)
Will continue to keep patient maxed on NEO over night since the plan will be to wean off of pressors per neurosurgery.

## 2019-10-11 ENCOUNTER — Inpatient Hospital Stay (HOSPITAL_COMMUNITY): Payer: BC Managed Care – PPO

## 2019-10-11 DIAGNOSIS — R4182 Altered mental status, unspecified: Secondary | ICD-10-CM

## 2019-10-11 DIAGNOSIS — I671 Cerebral aneurysm, nonruptured: Secondary | ICD-10-CM

## 2019-10-11 LAB — BASIC METABOLIC PANEL
Anion gap: 11 (ref 5–15)
Anion gap: 10 (ref 5–15)
Anion gap: 10 (ref 5–15)
Anion gap: 8 (ref 5–15)
Anion gap: 7 (ref 5–15)
BUN: <5 mg/dL — ABNORMAL LOW (ref 6–20)
BUN: <5 mg/dL — ABNORMAL LOW (ref 6–20)
BUN: <5 mg/dL — ABNORMAL LOW (ref 6–20)
BUN: <5 mg/dL — ABNORMAL LOW (ref 6–20)
BUN: <5 mg/dL — ABNORMAL LOW (ref 6–20)
CO2: 19 mmol/L — ABNORMAL LOW (ref 22–32)
CO2: 20 mmol/L — ABNORMAL LOW (ref 22–32)
CO2: 22 mmol/L (ref 22–32)
CO2: 22 mmol/L (ref 22–32)
CO2: 22 mmol/L (ref 22–32)
Calcium: 8.3 mg/dL — ABNORMAL LOW (ref 8.9–10.3)
Calcium: 8.6 mg/dL — ABNORMAL LOW (ref 8.9–10.3)
Calcium: 8.7 mg/dL — ABNORMAL LOW (ref 8.9–10.3)
Calcium: 8.3 mg/dL — ABNORMAL LOW (ref 8.9–10.3)
Calcium: 8.3 mg/dL — ABNORMAL LOW (ref 8.9–10.3)
Chloride: 111 mmol/L (ref 98–111)
Chloride: 110 mmol/L (ref 98–111)
Chloride: 108 mmol/L (ref 98–111)
Chloride: 109 mmol/L (ref 98–111)
Chloride: 110 mmol/L (ref 98–111)
Creatinine, Ser: 0.62 mg/dL (ref 0.44–1.00)
Creatinine, Ser: 0.61 mg/dL (ref 0.44–1.00)
Creatinine, Ser: 0.67 mg/dL (ref 0.44–1.00)
Creatinine, Ser: 0.6 mg/dL (ref 0.44–1.00)
Creatinine, Ser: 0.61 mg/dL (ref 0.44–1.00)
GFR calc Af Amer: >60 mL/min (ref >60)
GFR calc Af Amer: >60 mL/min (ref >60)
GFR calc Af Amer: >60 mL/min (ref >60)
GFR calc Af Amer: >60 mL/min (ref >60)
GFR calc Af Amer: >60 mL/min (ref >60)
GFR calc non Af Amer: >60 mL/min (ref >60)
GFR calc non Af Amer: >60 mL/min (ref >60)
GFR calc non Af Amer: >60 mL/min (ref >60)
GFR calc non Af Amer: >60 mL/min (ref >60)
GFR calc non Af Amer: >60 mL/min (ref >60)
Glucose, Bld: 83 mg/dL (ref 70–99)
Glucose, Bld: 88 mg/dL (ref 70–99)
Glucose, Bld: 91 mg/dL (ref 70–99)
Glucose, Bld: 82 mg/dL (ref 70–99)
Glucose, Bld: 78 mg/dL (ref 70–99)
Potassium: 3.4 mmol/L — ABNORMAL LOW (ref 3.5–5.1)
Potassium: 3.7 mmol/L (ref 3.5–5.1)
Potassium: 3.6 mmol/L (ref 3.5–5.1)
Potassium: 3.3 mmol/L — ABNORMAL LOW (ref 3.5–5.1)
Potassium: 3.6 mmol/L (ref 3.5–5.1)
Sodium: 141 mmol/L (ref 135–145)
Sodium: 140 mmol/L (ref 135–145)
Sodium: 140 mmol/L (ref 135–145)
Sodium: 139 mmol/L (ref 135–145)
Sodium: 139 mmol/L (ref 135–145)

## 2019-10-11 LAB — MAGNESIUM
Magnesium: 1.7 mg/dL (ref 1.7–2.4)
Magnesium: 1.6 mg/dL — ABNORMAL LOW (ref 1.7–2.4)
Magnesium: 2 mg/dL (ref 1.7–2.4)
Magnesium: 1.7 mg/dL (ref 1.7–2.4)

## 2019-10-11 LAB — PHOSPHORUS
Phosphorus: 2.3 mg/dL — ABNORMAL LOW (ref 2.5–4.6)
Phosphorus: 2 mg/dL — ABNORMAL LOW (ref 2.5–4.6)
Phosphorus: 2.2 mg/dL — ABNORMAL LOW (ref 2.5–4.6)
Phosphorus: 2.5 mg/dL (ref 2.5–4.6)

## 2019-10-11 LAB — GLUCOSE, CAPILLARY: Glucose-Capillary: 88 mg/dL (ref 70–99)

## 2019-10-11 LAB — OSMOLALITY: Osmolality: 280 mOsm/kg (ref 275–295)

## 2019-10-11 LAB — OSMOLALITY, URINE: Osmolality, Ur: 356 mOsm/kg (ref 300–900)

## 2019-10-11 MED ORDER — LORAZEPAM 2 MG/ML IJ SOLN
2.0000 mg | Freq: Once | INTRAMUSCULAR | Status: AC
  Administered 2019-10-11: 2 mg via INTRAVENOUS

## 2019-10-11 MED ORDER — LORAZEPAM 2 MG/ML IJ SOLN
INTRAMUSCULAR | Status: AC
  Administered 2019-10-11: 2 mg via INTRAVENOUS
  Filled 2019-10-11: qty 1

## 2019-10-11 MED ORDER — ALPRAZOLAM 0.25 MG PO TABS: 0.2500 mg | ORAL_TABLET | Freq: Two times a day (BID) | ORAL | Status: DC | PRN

## 2019-10-11 MED ORDER — VASOPRESSIN 20 UNIT/ML IV SOLN
0.0300 [IU]/min | INTRAVENOUS | Status: DC
  Filled 2019-10-11: qty 2

## 2019-10-11 MED ORDER — MAGNESIUM SULFATE 2 GM/50ML IV SOLN
2.0000 g | Freq: Once | INTRAVENOUS | Status: AC
  Administered 2019-10-11: 2 g via INTRAVENOUS
  Filled 2019-10-11: qty 50

## 2019-10-11 MED ORDER — HALOPERIDOL LACTATE 5 MG/ML IJ SOLN
5.0000 mg | Freq: Four times a day (QID) | INTRAMUSCULAR | Status: DC | PRN
  Administered 2019-10-12: 5 mg via INTRAVENOUS
  Filled 2019-10-11: qty 1

## 2019-10-11 MED ORDER — LORAZEPAM 2 MG/ML IJ SOLN: 2.0000 mg | Freq: Once | INTRAMUSCULAR | Status: AC

## 2019-10-11 MED ORDER — DESMOPRESSIN ACETATE 0.2 MG PO TABS
0.2000 mg | ORAL_TABLET | Freq: Every day | ORAL | Status: DC
  Administered 2019-10-11: 0.2 mg via ORAL
  Filled 2019-10-11 (×2): qty 1

## 2019-10-11 MED ORDER — POTASSIUM & SODIUM PHOSPHATES 280-160-250 MG PO PACK
1.0000 | PACK | Freq: Three times a day (TID) | ORAL | Status: AC
  Administered 2019-10-11 (×3): 1 via ORAL
  Filled 2019-10-11 (×3): qty 1

## 2019-10-11 NOTE — Progress Notes (Signed)
Rehab Admissions Coordinator Note:  Patient was screened by Clois Dupes for appropriateness for an Inpatient Acute Rehab Consult per PT recs. .  At this time, we are recommending Inpatient Rehab consult if patient would like to be considered for admit. Please advise.  Clois Dupes RN MSN 10/11/2019, 11:30 AM  I can be reached at [phone].

## 2019-10-11 NOTE — Evaluation (Signed)
Occupational Therapy Evaluation Patient Details Name: Alyssa Brooks MRN: [id number] DOB: 1985-10-16 Today's Date: 10/11/2019    History of Present Illness 34 y.o. female who was incidentally found to have a right cavernous carotid aneurysm during workup headache. Pt underwent surpass evolve embolization of RICA aneurysms which was complicated intra op by stent malposition and in-stent thrombus and Post op by [facility], vasospasm, left frontal infarct, no hydrocephalus. PMH significant for bipolar.   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 Pt currently requiring Min Guard A for UB ADLs, Min A for LB ADLs, and Min A +2 for functional mobility. Pt presenting with decreased cognition, balance, and activity tolerance impacting her occupational performance and participation. Pt presenting with impulsivity and quickly switching from playful behavior to frustration/agitation; benefiting from calming cues. During oral care, pt requiring increased cues for sequencing of task. Pt also with poor awareness of deficits. Pt will require further acute OT to facilitate dc and address cognition within ADLs. Recommend dc to CIR for intensive OT to optimize safety, balance, cognition, and functional performance.   HR elevating to 140s with activity. BP 165/90.    Follow Up Recommendations  CIR;Supervision/Assistance - 24 hour    Equipment Recommendations  Other (comment)(Defer to next venue)    Recommendations for Other Services PT consult;Rehab consult;Speech consult     Precautions / Restrictions Precautions Precautions: Fall Precaution Comments: monitor BP Restrictions Weight Bearing Restrictions: No      Mobility Bed Mobility Overal bed mobility: Needs Assistance Bed Mobility: Supine to Sit     Supine to sit: Supervision     General bed mobility comments: Close Supervision for safety  Transfers Overall transfer level: Needs assistance Equipment used:  None Transfers: Sit to/from Stand Sit to Stand: Min assist         General transfer comment: pt requiring minA to transfer from edge of bed    Balance Overall balance assessment: Needs assistance Sitting-balance support: Feet supported;No upper extremity supported Sitting balance-Leahy Scale: Fair Sitting balance - Comments: close supervision   Standing balance support: No upper extremity supported Standing balance-Leahy Scale: Poor Standing balance comment: modA for dynamic standing balance. minA for static standing balance                           ADL either performed or assessed with clinical judgement   ADL Overall ADL's : Needs assistance/impaired Eating/Feeding: Set up;Supervision/ safety;Sitting   Grooming: Oral care;Min guard;Sitting;Cueing for sequencing;Cueing for compensatory techniques;Wash/dry face Grooming Details (indicate cue type and reason): Pt performing oral care while seated at sink. Poor sequenicng and requiring Moderate questioning cues to transition between steps within task. Pt laying her arms and head on sink and sighing. Min Guard A for safety Upper Body Bathing: Min guard;Sitting   Lower Body Bathing: Minimal assistance;Sit to/from stand   Upper Body Dressing : Min guard;Sitting   Lower Body Dressing: Minimal assistance;Sit to/from stand   Toilet Transfer: Minimal assistance;+2 for safety/equipment;Ambulation;BSC Toilet Transfer Details (indicate cue type and reason): Min A for balance and safety         Functional mobility during ADLs: Minimal assistance;+2 for safety/equipment General ADL Comments: Pt presenting with poor cognition, balance, and activity tolerance impacting her safe performance of ADLs.      Vision Baseline Vision/History: No visual deficits Patient Visual Report: No change from baseline       Perception     Praxis  Pertinent Vitals/Pain Pain Assessment: Faces Faces Pain Scale: Hurts even more Pain  Location: catheter site. headache Pain Descriptors / Indicators: Guarding Pain Intervention(s): Monitored during session;Limited activity within patient's tolerance;Repositioned     Hand Dominance Right   Extremity/Trunk Assessment Upper Extremity Assessment Upper Extremity Assessment: Overall WFL for tasks assessed   Lower Extremity Assessment Lower Extremity Assessment: Defer to PT evaluation   Cervical / Trunk Assessment Cervical / Trunk Assessment: Normal   Communication Communication Communication: No difficulties   Cognition Arousal/Alertness: Awake/alert Behavior During Therapy: Impulsive;Agitated(mild agitation) Overall Cognitive Status: Impaired/Different from baseline Area of Impairment: Orientation;Attention;Memory;Following commands;Safety/judgement;Awareness;Problem solving                 Orientation Level: Disoriented to;Time(requires cues for time) Current Attention Level: Sustained Memory: Decreased recall of precautions;Decreased short-term memory Following Commands: Follows one step commands consistently;Follows multi-step commands with increased time;Follows one step commands with increased time Safety/Judgement: Decreased awareness of safety;Decreased awareness of deficits Awareness: Intellectual Problem Solving: Slow processing;Requires verbal cues;Difficulty sequencing General Comments: pt very impulsive during session, taking frequent breaks between activities. Poor attention and requiring cues for sequencing and to fully finish a task. Pt with difficulty sequencing activities and requiring cues for ADLs at sink. Pt quickly switching between playful and frustrated. Requiring calming cues.    General Comments  Pt tachy up 140s with standing/ambulation but quickly returns to low 100s with sitting. Pt intermittently agitated but redirectable with cues. Pt's mother initially present during session but leaves during session to see family member     Exercises     Shoulder Instructions      Home Living Family/patient expects to be discharged to:: Private residence Living Arrangements: Alone Available Help at Discharge: Family;Other (Comment);Available 24 hours/day(significant other, mother) Type of Home: House Home Access: Stairs to enter [employer] of Steps: 5 Entrance Stairs-Rails: (need to clarify if railings available) Home Layout: One level     Bathroom Shower/Tub: Walk-in shower;Tub/shower unit   Bathroom Toilet: Standard     Home Equipment: None          Prior Functioning/Environment Level of Independence: Independent        Comments: working as Contractor Problem List: Decreased range of motion;Decreased activity tolerance;Impaired balance (sitting and/or standing);Decreased cognition;Decreased safety awareness;Decreased knowledge of use of DME or AE;Decreased knowledge of precautions;Pain      OT Treatment/Interventions: Self-care/ADL training;Therapeutic exercise;Energy conservation;DME and/or AE instruction;Therapeutic activities;Patient/family education    OT Goals(Current goals can be found in the care plan section) Acute Rehab OT Goals Patient Stated Goal: To go home OT Goal Formulation: With patient Time For Goal Achievement: 10/25/19 Potential to Achieve Goals: Good  OT Frequency: Min 2X/week   Barriers to D/C:            Co-evaluation PT/OT/SLP Co-Evaluation/Treatment: Yes Reason for Co-Treatment: For patient/therapist safety;To address functional/ADL transfers PT goals addressed during session: Mobility/safety with mobility;Balance OT goals addressed during session: ADL's and self-care      AM-PAC OT "6 Clicks" Daily Activity     Outcome Measure Help from another person eating meals?: A Little Help from another person taking care of personal grooming?: A Little Help from another person toileting, which includes using toliet, bedpan, or urinal?: A Lot Help from  another person bathing (including washing, rinsing, drying)?: A Lot Help from another person to put on and taking off regular upper body clothing?: A Little Help from another person to put on and taking off  regular lower body clothing?: A Lot 6 Click Score: 15   End of Session Equipment Utilized During Treatment: Gait belt Nurse Communication: Mobility status  Activity Tolerance: Patient tolerated treatment well Patient left: in chair;with call bell/phone within reach;with chair alarm set  OT Visit Diagnosis: Unsteadiness on feet (R26.81);Other abnormalities of gait and mobility (R26.89);Muscle weakness (generalized) (M62.81);Pain Pain - part of body: (Cath site. headache)                Time: 0315-9458 OT Time Calculation (min): 22 min Charges:  OT General Charges $OT Visit: 1 Visit OT Evaluation $OT Eval Moderate Complexity: 1 Mod  Alyssa Brooks MSOT, OTR/L Acute Rehab Pager: [phone] Office: [phone]  Alyssa Brooks 10/11/2019, 10:28 AM

## 2019-10-11 NOTE — Progress Notes (Signed)
Transcranial Doppler  Date POD PCO2 HCT BP  MCA ACA PCA OPHT SIPH VERT Basilar  4/7 JP     Right  Left   119  62   -49  -129   26  52   15  22   102  82   -62  -36   -70      4/9 MS     Right  Left   71  40   -46  -57   23  16   20  28    75  54   -39  -37   *           Right  Left                                             Right  Left                                             Right  Left                                            Right  Left                                            Right  Left                                        MCA = Middle Cerebral Artery      OPHT = Opthalmic Artery     BASILAR = Basilar Artery   ACA = Anterior Cerebral Artery     SIPH = Carotid Siphon PCA = Posterior Cerebral Artery   VERT = Verterbral Artery                   Normal MCA = 62+\-12 ACA = 50+\-12 PCA = 42+\-23    *Unable to be insonated.  10/11/2019 12:36 PM 12/11/2019, MHA, RVT, RDCS, RDMS

## 2019-10-11 NOTE — Progress Notes (Signed)
NAME:  Alyssa Brooks, MRN:  [id number], DOB:  1986/04/28, LOS: 3 ADMISSION DATE:  10/08/2019, CONSULTATION DATE: 10/10/2019 REFERRING MD: Cato Mulligan, CHIEF COMPLAINT: Tachycardia  Brief History   Patient is a 34 year old female status post status post surpass devolve embolization of R ICA aneurysms with intermittent bradycardia and tachycardia  History of present illness   Patient is a 34 year old who on 10/08/2019 underwent elective surpass involved embolization of R ICA aneurysms.  She has had intermittent episodes of bradycardia last night having a transient episode of Mobitz type I.  Asked to see today after a transient episode of sinus tachycardia up to 180.  At the time of my evaluation the patient has a systolic of 742 and a pulse of 61 resting quietly.  She is easily arousable but is disoriented as to place and time.  Currently she is not combative.  Repeat CT of the brain done this evening shows slight increase in edema but otherwise no significant change. Patient has had large volume output of about 5 L over the course of the day and seems to be behind approximately a liter.  Recent specific gravity was 1.1.  She just received a 1 L bolus which may have helped with her tachycardia and blood pressure.  Currently she is on maximum peripheral IV dose of Neo-Synephrine.  She is on aspirin and Plavix.  She has had a bradycardic episode this seems to be related to Zofran.  Due to patient's degree of disorientation intermittent combativeness and anticoagulation placement of a central line if necessary will be difficult.  Past Medical History   . Bipolar 1 disorder ([place])   . Headache      Significant Hospital Events   Right ICA embolization as noted above.  Consults:  PCCM  Procedures:  As above  Significant Diagnostic Tests:  10/09/2019 CT Head WO Contrast Stable since this morning small volume of subarachnoid hemorrhage in the suprasellar cistern and tracking into the right  sylvian fissure. 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 No associated hemorrhagic transformation or mass effect. No new intracranial abnormality  Micro Data:  NA  Antimicrobials:  NA  Interim history/subjective:  Polyuria  Continued high volume UO Was treated with 2 mcg of DDAVP 4/8 Fluid replacement protocol was initiated( .75 per cc UO) BMP, Mg, phos Q 4 with replacement prn Maxed out out Phenyepherine to maintain SBP > 160  Objective   Blood pressure (!) 127/48, pulse 66, temperature 98.6 F (37 C), temperature source Oral, resp. rate 16, height 5\' 4"  (1.626 m), weight 81.6 kg, last menstrual period 09/18/2019, SpO2 99 %.        Intake/Output Summary (Last 24 hours) at 10/11/2019 0811 Last data filed at 10/11/2019 0800 Gross per 24 hour  Intake 12092.61 ml  Output 18225 ml  Net -6132.39 ml   Filed Weights   10/08/19 1101  Weight: 81.6 kg    Examination: General: Black female awake and agitated, states she wants to go home HENT: Within normal limits Lungs: Bilateral chest excursion, clear throughout Cardiovascular: S1, S2, RRR, No MRG Abdomen: Benign Extremities: Within normal limits Neuro: Nonfocal, disoriented x3 GU: dilute clear urine in copious amounts  Resolved Hospital Problem list   NA  Assessment & Plan:  Status post embolization of R ICA:  Current therapy per neurosurgery Goal BP > 160 Maxed out on Neo through PICC Plan Switch to Levophed now PICC placed BP Goals per neuro as  Above  Central  DI Continued high volume UO 18,350 cc's last 12 hours Net negative 7,613 cc's Received DDAVP 4/8 Sodium stable at 140 Hypo Mag>> Will replete with Mag 2 grams Hypo Phos>> Will replete with K phos Neural 1 pk x 3 today Plan Continue fluid replacement protocol 0.75 cc's per ml of UO Will add shock dose of vasopressin If no improvement with vasopressin we will repeat dose of DDAVP this  afternoon. Start Levophed through PICC Trend BMET , Mag and Phos Q 6 Will check ionized calcium Replete electrolytes prn Trend plasma and urine osmolality Trend Specific Gravity Q 4  Will check ionized calcium today   Bradycardia HR 65 per tele Per nursing no brady events Plan Tele monitoring  Agitation BiPolar Disease>> No home maintenence States she wants to go home Plan Teaching re need to remain in ICU for care done with patient Health care Consequences of leaving before resolution of DI  discussed Will add xanax 0.25 mg BID prn to help with agitation   Best practice:  Diet: Continue current Pain/Anxiety/Delirium protocol (if indicated): Intermittent Precedex VAP protocol (if indicated): N/A DVT prophylaxis: Per neurosurgery GI prophylaxis: N/A Glucose control: NA Mobility: Bedrest Code Status: Full Family Communication:Pt. And mother updated at bedside 4/9 Disposition: ICU  Labs   CBC: Recent Labs  Lab 10/04/19 1157 10/08/19 2153 10/09/19 0115 10/10/19 0835  WBC 7.6  --   --  12.2*  NEUTROABS 3.6  --   --   --   HGB 12.2 9.7* 9.7* 8.3*  HCT 37.8 30.3* 30.4* 25.3*  MCV 87.9  --   --  86.3  PLT 307  --   --  240    Basic Metabolic Panel: Recent Labs  Lab 10/10/19 1300 10/10/19 1822 10/10/19 2139 10/11/19 0215 10/11/19 0630  NA 141 141 141 140 140  K 3.7 3.3* 3.4* 3.7 3.6  CL 111 113* 111 110 108  CO2 18* 19* 19* 20* 22  GLUCOSE 93 97 83 88 91  BUN <5* <5* <5* <5* <5*  CREATININE 0.66 0.66 0.62 0.61 0.67  CALCIUM 8.1* 7.9* 8.3* 8.6* 8.7*  MG  --   --  1.7 1.7 1.6*  PHOS  --   --  2.1* 2.3* 2.0*   GFR: Estimated Creatinine Clearance: 100.5 mL/min (by C-G formula based on SCr of 0.67 mg/dL). Recent Labs  Lab 10/04/19 1157 10/10/19 0835  WBC 7.6 12.2*    Liver Function Tests: No results for input(s): AST, ALT, ALKPHOS, BILITOT, PROT, ALBUMIN in the last 168 hours. No results for input(s): LIPASE, AMYLASE in the last 168 hours. No  results for input(s): AMMONIA in the last 168 hours.  ABG    Component Value Date/Time   TCO2 25 07/04/2008 1652     Coagulation Profile: Recent Labs  Lab 10/04/19 1157  INR 1.1    Cardiac Enzymes: No results for input(s): CKTOTAL, CKMB, CKMBINDEX, TROPONINI in the last 168 hours.  HbA1C: No results found for: HGBA1C  CBG: No results for input(s): GLUCAP in the last 168 hours.  Review of Systems:   Unable to obtain due to disorientation  Past Medical History  She,  has a past medical history of Bipolar 1 disorder (HCC) and Headache.  Allergies Allergies  Allergen Reactions  . Adhesive [Tape]     Paper tape pulls off skin, regular adhesive tape is fine  . Keflex [Cephalexin Monohydrate] Diarrhea and Nausea And Vomiting  . Penicillins Nausea And Vomiting    Did it involve swelling  of the face/tongue/throat, SOB, or low BP? No Did it involve sudden or severe rash/hives, skin peeling, or any reaction on the inside of your mouth or nose? No Did you need to seek medical attention at a hospital or doctor's office? No When did it last happen?3 + years If all above answers are "NO", may proceed with cephalosporin use.      Home Medications  Prior to Admission medications   Medication Sig Start Date End Date Taking? Authorizing Provider  Aspirin-Acetaminophen-Caffeine (GOODY HEADACHE PO) Take 1 packet by mouth daily as needed (headaches).   Yes [provider]  clopidogrel (PLAVIX) 75 MG tablet Take 75 mg by mouth daily.   Yes [provider]  aspirin 325 MG tablet Take 325 mg by mouth daily.    [provider]     Critical care time: Over 45 minutes spent in bedside evaluation chart review critical care planning     Bevelyn Ngo, MSN, AGACNP-BC [facility] Pulmonary/Critical Care Medicine Pager # [phone] After 4 pm please call [phone] 10/11/2019 8:11 AM

## 2019-10-11 NOTE — Progress Notes (Addendum)
  NEUROSURGERY PROGRESS NOTE   Pt continues to have increased urine output. Also is intermittently combative, crying out without clear reason. Has threatened to leave hospital AMA requiring security at bedside.  EXAM:  BP (!) 178/100   Pulse (!) 53   Temp 98.5 F (36.9 C) (Oral)   Resp 16   Ht 5\' 4"  (1.626 m)   Wt 81.6 kg   LMP 09/18/2019 (Approximate)   SpO2 (!) 88%   BMI 30.90 kg/m   Awake, alert, oriented  Speech fluent, appropriate  CN grossly intact  5/5 BUE/BLE   IMPRESSION: - 34 y.o. female s/p Surpass embolization RICA aneurysms, appears to be at neurologic baseline. Suspect some of her behavior is related to known hx of Axis I and Axis II disorders. 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 - Do not feel she will need Nimotop at home, will d/c upon discharge.

## 2019-10-11 NOTE — Progress Notes (Signed)
Neurosurgery  Called by nurse that patient is trying to leave AMA.  I came to the bedside and had a long discussion with the patient, with her mother present.  Given her DI and sodium balance have not stabilized and is still getting volume repletion, I strongly recommended she stay to continue medical treatment here, at least until tomorrow.  I discussed with her the potential deleterious consequences of leaving AMA. Asked about why she would like to leave, she said her back is uncomfortable and she is having stomach cramps, and was told she would be able to leave the day after her surgery.  I explained we will make her as comfortable as possible.  In that vein, I am giving her some Fentanyl for her back pain as well as Ativan for agitation. She is alert and oriented x 3 and her speech is coherent but has a history of bipolar disorder and axis 2 disorder with history of hospitalization.  Per the mother, she has a history of rash judgment and impatience, so this behavior is fairly consistent with her baseline.

## 2019-10-11 NOTE — Progress Notes (Signed)
Update Neuro discontinued vasopressors Plan is to give an additional dose of DDAVP po ( 0.2 mg)  We will re-evaluate this afternoon for need to add additional dosing of IV DDAVP Will check specific gravity , Serum os and urine os four hours after po dosing to determine need for additional IV dosing. Goal per neuro is to d/c home within next 24 hours if DI has resolved.  Bevelyn Ngo, MSN, AGACNP-BC [facility] Pulmonary/Critical Care Medicine Pager # [phone] After 4 pm please call [phone] 10/11/2019 10:38 AM

## 2019-10-11 NOTE — Evaluation (Signed)
Physical Therapy Evaluation Patient Details Name: Alyssa Brooks MRN: [id number] DOB: Aug 03, 1985 Today's Date: 10/11/2019   History of Present Illness  34 y.o. female who was incidentally found to have a right cavernous carotid aneurysm during workup headache. Pt underwent surpass evolve embolization of RICA aneurysms which was complicated intra op by stent malposition and in-stent thrombus and Post op by [facility], vasospasm, left frontal infarct, no hydrocephalus. PMH significant for bipolar.  Clinical Impression  Pt presents to PT with significant deficits in cognition, safety awareness and awareness of deficits, mobility, and balance. Pt at a high risk for falls currently due to instability, and impaired recall of her current functional deficits. Pt requiring cueing from PT/OT to recognize balance deficits even after recently requiring assistance to prevent falls during ambulation. Pt is impulsive and intermittently mildly agitated but is easily redirectable during session. Pt will benefit from continued acute PT POC to improve balance, safety awareness, and to restore independence.    Follow Up Recommendations CIR;Supervision/Assistance - 24 hour    Equipment Recommendations  Rolling walker with 5" wheels(if going home)    Recommendations for Other Services       Precautions / Restrictions Precautions Precautions: Fall Precaution Comments: monitor BP Restrictions Weight Bearing Restrictions: No      Mobility  Bed Mobility Overal bed mobility: Needs Assistance Bed Mobility: Supine to Sit     Supine to sit: Supervision        Transfers Overall transfer level: Needs assistance Equipment used: None Transfers: Sit to/from Stand Sit to Stand: Min assist         General transfer comment: pt requiring minA to transfer from edge of bed  Ambulation/Gait Ambulation/Gait assistance: Mod assist Gait Distance (Feet): 15 Feet(additional 10') Assistive device: None Gait  Pattern/deviations: Step-to pattern;Staggering left;Staggering right;Drifts right/left Gait velocity: reduced Gait velocity interpretation: <1.8 ft/sec, indicate of risk for recurrent falls General Gait Details: pt with significant lateral sway and multiple LOB during short distance of ambulation from bed to bathroom. Pt with reduced awareness of balance deficits. Pt requiring modA to correct multiple LOB  Stairs            Wheelchair Mobility    Modified Rankin (Stroke Patients Only) Modified Rankin (Stroke Patients Only) Pre-Morbid Rankin Score: No symptoms Modified Rankin: Moderately severe disability     Balance Overall balance assessment: Needs assistance Sitting-balance support: Feet supported;No upper extremity supported Sitting balance-Leahy Scale: Fair Sitting balance - Comments: close supervision   Standing balance support: No upper extremity supported Standing balance-Leahy Scale: Poor Standing balance comment: modA for dynamic standing balance. minA for static standing balance                             Pertinent Vitals/Pain Pain Assessment: Faces Faces Pain Scale: Hurts even more Pain Location: catheter site Pain Descriptors / Indicators: Guarding Pain Intervention(s): Monitored during session    Home Living Family/patient expects to be discharged to:: Private residence Living Arrangements: Alone Available Help at Discharge: Family;Other (Comment);Available 24 hours/day(significant other, mother) Type of Home: House Home Access: Stairs to enter Entrance Stairs-Rails: (need to clarify if railings available) Entrance Stairs-Number of Steps: 5 Home Layout: One level Home Equipment: None      Prior Function Level of Independence: Independent         Comments: working as Optician, dispensing        Extremity/Trunk Assessment   Upper Extremity Assessment Upper  Extremity Assessment: Defer to OT evaluation    Lower Extremity  Assessment Lower Extremity Assessment: Overall WFL for tasks assessed    Cervical / Trunk Assessment Cervical / Trunk Assessment: Normal  Communication   Communication: No difficulties  Cognition Arousal/Alertness: Awake/alert Behavior During Therapy: Impulsive;Agitated(mild agitation) Overall Cognitive Status: Impaired/Different from baseline Area of Impairment: Orientation;Attention;Memory;Following commands;Safety/judgement;Awareness;Problem solving                 Orientation Level: Disoriented to;Time(requires cues for time) Current Attention Level: Sustained Memory: Decreased recall of precautions;Decreased short-term memory Following Commands: Follows one step commands consistently;Follows multi-step commands with increased time Safety/Judgement: Decreased awareness of safety;Decreased awareness of deficits Awareness: Intellectual Problem Solving: Slow processing;Requires verbal cues;Difficulty sequencing General Comments: pt very impulsive during session, taking frequent breaks between activities. Pt with difficulty sequencing activities and requiring cues for ADLs at sink      General Comments General comments (skin integrity, edema, etc.): Pt tachy up 140s with standing/ambulation but quickly returns to low 100s with sitting. Pt intermittently agitated but redirectable with cues. Pt's mother initially present during session but leaves during session to see family member    Exercises     Assessment/Plan    PT Assessment Patient needs continued PT services  PT Problem List Decreased activity tolerance;Decreased balance;Decreased mobility;Decreased cognition;Decreased knowledge of use of DME;Decreased safety awareness;Other (comment)(cognition)       PT Treatment Interventions DME instruction;Gait training;Stair training;Functional mobility training;Therapeutic activities;Therapeutic exercise;Balance training;Neuromuscular re-education;Cognitive  remediation;Patient/family education    PT Goals (Current goals can be found in the Care Plan section)  Acute Rehab PT Goals Patient Stated Goal: To go home PT Goal Formulation: With patient Time For Goal Achievement: 10/25/19 Potential to Achieve Goals: Good Additional Goals Additional Goal #1: Pt will maintain dynamic standing balance within 10 inches of her base of support with supervision and no UE support    Frequency Min 4X/week   Barriers to discharge        Co-evaluation PT/OT/SLP Co-Evaluation/Treatment: Yes Reason for Co-Treatment: Complexity of the patient's impairments (multi-system involvement);Necessary to address cognition/behavior during functional activity;For patient/therapist safety;To address functional/ADL transfers PT goals addressed during session: Mobility/safety with mobility;Balance         AM-PAC PT "6 Clicks" Mobility  Outcome Measure Help needed turning from your back to your side while in a flat bed without using bedrails?: A Little Help needed moving from lying on your back to sitting on the side of a flat bed without using bedrails?: A Little Help needed moving to and from a bed to a chair (including a wheelchair)?: A Little Help needed standing up from a chair using your arms (e.g., wheelchair or bedside chair)?: A Little Help needed to walk in hospital room?: A Lot Help needed climbing 3-5 steps with a railing? : Total 6 Click Score: 15    End of Session   Activity Tolerance: Patient tolerated treatment well Patient left: in chair;with call bell/phone within reach;with chair alarm set Nurse Communication: Mobility status PT Visit Diagnosis: Unsteadiness on feet (R26.81);Other abnormalities of gait and mobility (R26.89);Other symptoms and signs involving the nervous system (R29.898)    Time: 2694-8546 PT Time Calculation (min) (ACUTE ONLY): 20 min   Charges:   PT Evaluation $PT Eval High Complexity: 1 High          Arlyss Gandy, PT,  DPT Acute Rehabilitation Pager: [phone]   Arlyss Gandy 10/11/2019, 10:08 AM

## 2019-10-12 LAB — OSMOLALITY, URINE: Osmolality, Ur: 423 mOsm/kg (ref 300–900)

## 2019-10-12 LAB — CBC
HCT: 23.6 % — ABNORMAL LOW (ref 36.0–46.0)
Hemoglobin: 7.5 g/dL — ABNORMAL LOW (ref 12.0–15.0)
MCH: 27.9 pg (ref 26.0–34.0)
MCHC: 31.8 g/dL (ref 30.0–36.0)
MCV: 87.7 fL (ref 80.0–100.0)
Platelets: 222 10*3/uL (ref 150–400)
RBC: 2.69 MIL/uL — ABNORMAL LOW (ref 3.87–5.11)
RDW: 14.3 % (ref 11.5–15.5)
WBC: 8.9 10*3/uL (ref 4.0–10.5)
nRBC: 0 % (ref 0.0–0.2)

## 2019-10-12 LAB — OSMOLALITY: Osmolality: 288 mOsm/kg (ref 275–295)

## 2019-10-12 LAB — BASIC METABOLIC PANEL
Anion gap: 9 (ref 5–15)
BUN: <5 mg/dL — ABNORMAL LOW (ref 6–20)
CO2: 22 mmol/L (ref 22–32)
Calcium: 8.1 mg/dL — ABNORMAL LOW (ref 8.9–10.3)
Chloride: 108 mmol/L (ref 98–111)
Creatinine, Ser: 0.67 mg/dL (ref 0.44–1.00)
GFR calc Af Amer: >60 mL/min (ref >60)
GFR calc non Af Amer: >60 mL/min (ref >60)
Glucose, Bld: 81 mg/dL (ref 70–99)
Potassium: 3.5 mmol/L (ref 3.5–5.1)
Sodium: 139 mmol/L (ref 135–145)

## 2019-10-12 LAB — PHOSPHORUS
Phosphorus: 2.5 mg/dL (ref 2.5–4.6)
Phosphorus: 3 mg/dL (ref 2.5–4.6)
Phosphorus: 2.8 mg/dL (ref 2.5–4.6)

## 2019-10-12 LAB — MAGNESIUM
Magnesium: 2 mg/dL (ref 1.7–2.4)
Magnesium: 1.6 mg/dL — ABNORMAL LOW (ref 1.7–2.4)
Magnesium: 1.7 mg/dL (ref 1.7–2.4)

## 2019-10-12 MED ORDER — ASPIRIN 325 MG PO TABS: 325.0000 mg | ORAL_TABLET | Freq: Every day | ORAL | 2 refills | Status: DC

## 2019-10-12 MED ORDER — CLOPIDOGREL BISULFATE 75 MG PO TABS: 75.0000 mg | ORAL_TABLET | Freq: Every day | ORAL | 2 refills | Status: DC

## 2019-10-12 MED ORDER — DESMOPRESSIN ACETATE 0.1 MG PO TABS: 0.1000 mg | ORAL_TABLET | Freq: Every day | ORAL | 0 refills | Status: DC

## 2019-10-12 MED ORDER — DESMOPRESSIN ACETATE 0.1 MG PO TABS
0.1000 mg | ORAL_TABLET | Freq: Every day | ORAL | Status: DC
  Administered 2019-10-12: 0.1 mg via ORAL

## 2019-10-12 NOTE — Progress Notes (Signed)
Pt refuses to be on the monitor, cannot obtain vitals.

## 2019-10-12 NOTE — Progress Notes (Signed)
Subjective: Patient reports no headaches, no complaints.  Some mild back pain when sleeping on bed.  Objective: Vital signs in last 24 hours: Temp:  [98 F (36.7 C)-98.9 F (37.2 C)] 98.9 F (37.2 C) (04/10 0400) Pulse Rate:  [53-102] 91 (04/10 0600) BP: (112-178)/(60-100) 171/86 (04/10 0600) SpO2:  [88 %-100 %] 99 % (04/10 0600)  Intake/Output from previous day: 04/09 0701 - 04/10 0700 In: 7504.1 [I.V.:7404.2; IV Piggyback:99.8] Out: 8150 [Urine:8150] Intake/Output this shift: No intake/output data recorded.  Awake, alert, Ox3 FC x 4, no drift. Groins soft, no swelling.  Some discoloration in left groin.  Lab Results: Recent Labs    10/10/19 0835 10/12/19 0447  WBC 12.2* 8.9  HGB 8.3* 7.5*  HCT 25.3* 23.6*  PLT 240 222   BMET Recent Labs    10/11/19 2135 10/12/19 0447  NA 139 139  K 3.6 3.5  CL 110 108  CO2 22 22  GLUCOSE 78 81  BUN <5* <5*  CREATININE 0.61 0.67  CALCIUM 8.3* 8.1*    Studies/Results: VAS [place] TRANSCRANIAL DOPPLER  Result Date: 10/11/2019  Transcranial Doppler Indications: Stroke. Performing Technologist: Maudry Mayhew MHA, RDMS, RVT, RDCS  Examination Guidelines: A complete evaluation includes B-mode imaging, spectral Doppler, color Doppler, and power Doppler as needed of all accessible portions of each vessel. Bilateral testing is considered an integral part of a complete examination. Limited examinations for reoccurring indications may be performed as noted.  +----------+-------------+----------+-----------+-------+ RIGHT TCD Right VM (cm)Depth (cm)PulsatilityComment +----------+-------------+----------+-----------+-------+ MCA           71.00                 0.73            +----------+-------------+----------+-----------+-------+ ACA          -46.00                 0.88            +----------+-------------+----------+-----------+-------+ Term ICA      57.00                 1.06             +----------+-------------+----------+-----------+-------+ PCA           23.00                 0.75            +----------+-------------+----------+-----------+-------+ Opthalmic     20.00                 1.12            +----------+-------------+----------+-----------+-------+ ICA siphon    75.00                 0.83            +----------+-------------+----------+-----------+-------+ Vertebral    -39.00                 0.72            +----------+-------------+----------+-----------+-------+ Distal ICA    46.00                                 +----------+-------------+----------+-----------+-------+  +----------+------------+----------+-----------+-------+ LEFT TCD  Left VM (cm)Depth (cm)PulsatilityComment +----------+------------+----------+-----------+-------+ MCA          40.00                 0.78            +----------+------------+----------+-----------+-------+  ACA          -57.00                0.44            +----------+------------+----------+-----------+-------+ Term ICA     45.00                 0.85            +----------+------------+----------+-----------+-------+ PCA          16.00                 0.71            +----------+------------+----------+-----------+-------+ Opthalmic    28.00                 1.08            +----------+------------+----------+-----------+-------+ ICA siphon   54.00                 0.91            +----------+------------+----------+-----------+-------+ Vertebral    -37.00                0.62            +----------+------------+----------+-----------+-------+ Distal ICA   41.00                                 +----------+------------+----------+-----------+-------+  +----------------------+----+ Right Lindegaard Ratio1.54 +----------------------+----+ +---------------------+----+ Left Lindegaard Ratio0.98 +---------------------+----+    Preliminary    [place] EKG SITE RITE  Result  Date: 10/10/2019 If Site Rite image not attached, placement could not be confirmed due to current cardiac rhythm.   Assessment/Plan: S/p complicated stenting of R ICA aneurysm - I had a long discussion with the patient.  Although I recommended staying in the hospital for continued monitoring of her UOP and sodium, she strongly wished to leave and almost certainly would leave AMA if not discharged.  As such, I feel it is safest to discharge her in a controlled manner.  She will be discharged on DDAVP 0.1 mg, aspirin and plavix.  She will need to f/u with her PCP for lab checks and with Dr. Conchita Paris shortly after discharge.  All questions and concerns were answered.   LOS: 4 days     Bedelia Person 10/12/2019, 9:57 AM

## 2019-10-12 NOTE — Progress Notes (Signed)
PT Cancellation Note  Patient Details Name: Tearah Saulsbury MRN: [id number] DOB: Sep 12, 1985   Cancelled Treatment:    Reason Eval/Treat Not Completed: Medical issues which prohibited therapy. Per conversation with medical team pt very agitated this morning, threatening to leave AMA, not appropriate or safe to participate in skilled PT session this morning. PT will continue to follow as time allows.   Arlyss Gandy 10/12/2019, 10:58 AM

## 2019-10-12 NOTE — Discharge Summary (Signed)
Physician Discharge Summary Note  Patient:  Alyssa Brooks is an 34 y.o., female MRN:  [id number] DOB:  1986/07/02 Patient phone:  [phone] (home)  Patient address:   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  This was complicated by instent thrombosis and collapse of stent requiring additional neuroform stents to be placed.  Postop patient was maintained on aspirin and plavix and continued on Integrilin gtt with bilateral femoral sheaths.  However, she soon postop developed large left groin hematoma with significant arterial bleeding requiring discontinuation of the Integrilin.  She also developed a small left frontal CVA in frontopolar ACA branch territory and was found to have ACA spasm related to catheter manipulation and small extravasation/SAH.  She was monitored with TCDs and given neosynephrine to help increase CPP.  Her TCDs remained good and neurologically she was at her baseline.  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  Her DI appeared well controlled and she strongly wished to be discharged.  Additional monitoring was advised but as patient was likely to leave AMA, it was felt patient would be better served with a controlled discharge.  Her groin was soft, pulses good, and she was at her neurologic baseline on the day of discharge.    Principal Problem: R ICA aneurysm  Discharge Diagnoses: Active Problems:   Carotid artery stenosis   Cerebral aneurysm, nonruptured ACA stroke Diabetes insipidus    Past Medical History:  Past Medical History:  Diagnosis Date  . Bipolar 1 disorder (HCC)   . Headache     Past Surgical History:  Procedure Laterality Date  . BREAST CYST EXCISION    . IR ANGIO INTRA EXTRACRAN SEL INTERNAL CAROTID BILAT MOD SED  08/02/2019  . IR ANGIO  VERTEBRAL SEL VERTEBRAL UNI R MOD SED  08/02/2019  . IR PATIENT EVAL TECH 0-60 MINS  10/09/2019  . RADIOLOGY WITH ANESTHESIA Right 10/08/2019   Procedure: Surpass Evolve embolization of right carotid aneurysm;  Surgeon: Lisbeth Renshaw, MD;  Location: [facility] OR;  Service: Radiology;  Laterality: Right;  . TUBAL LIGATION     Family History:  Family History  Problem Relation Age of Onset  . Cancer Mother        cervical  . Cancer Maternal Grandmother        uterine  . Hypertension Maternal Grandfather   . Diabetes Maternal Grandfather   Social History:  Social History   Substance and Sexual Activity  Alcohol Use Yes   Comment: rare     Social History   Substance and Sexual Activity  Drug Use Yes  . Frequency: 7.0 times per week  . Types: Marijuana    Social History   Socioeconomic History  . Marital status: Single    Spouse name: Not on file  . Number of children: Not on file  . Years of education: Not on file  . Highest education level: Not on file  Occupational History  . Not on file  Tobacco Use  . Smoking status: Current Every Day Smoker    Packs/day: 0.50    Types: Cigarettes  . Smokeless tobacco: Never Used  Substance and Sexual Activity  . Alcohol use: Yes    Comment: rare  . Drug use: Yes    Frequency: 7.0 times per week  Types: Marijuana  . Sexual activity: Yes    Birth control/protection: Surgical  Other Topics Concern  . Not on file  Social History Narrative  . Not on file   Social Determinants of Health   Financial Resource Strain:   . Difficulty of Paying Living Expenses:   Food Insecurity:   . Worried About Charity fundraiser in the Last Year:   . Arboriculturist in the Last Year:   Transportation Needs:   . Film/video editor (Medical):   Marland Kitchen Lack of Transportation (Non-Medical):   Physical Activity:   . Days of Exercise per Week:   . Minutes of Exercise per Session:   Stress:   . Feeling of Stress :   Social Connections:   .  Frequency of Communication with Friends and Family:   . Frequency of Social Gatherings with Friends and Family:   . Attends Religious Services:   . Active Member of Clubs or Organizations:   . Attends Archivist Meetings:   Marland Kitchen Marital Status:      Follow-up Information    Consuella Lose, MD. Schedule an appointment as soon as possible for a visit in 5 day(s).   Specialty: Neurosurgery Contact information: [address] [place] [address] [phone]           Follow-up recommendations:  F/u with PCP for outpatient labs  Signed: Vallarie Mare, MD 10/12/2019, 11:16 AM

## 2019-10-12 NOTE — Progress Notes (Signed)
PCCM Interval Note  Patient agitated and threatening to leave AMA. Attempting to self-remove PICC line while in room. Primary team planning to discharge patient. Regarding desmopressin use, would recommend titrating down from 0.2 mg to 0.1 mg and to continue this dose if patient is discharged. She will need to be followed closely as patient regarding UOP and can be further titrated down or weaned off.  Discussed with Primary team, RN and pharmacy.  Mechele Collin, M.D. [facility] Pulmonary/Critical Care Medicine 10/12/2019 9:57 AM

## 2019-10-12 NOTE — Discharge Instructions (Signed)
F/u with primary care physician this week- will need lab check

## 2019-10-13 LAB — CALCIUM, IONIZED: Calcium, Ionized, Serum: 5 mg/dL (ref 4.5–5.6)

## 2019-10-17 ENCOUNTER — Other Ambulatory Visit: Payer: Self-pay | Admitting: *Deleted

## 2019-10-17 ENCOUNTER — Encounter (HOSPITAL_COMMUNITY): Payer: Self-pay

## 2019-10-17 NOTE — Patient Outreach (Signed)
[facility]) Care Management  10/17/2019  Alyssa Brooks 24-Oct-1985 [id number]   Subjective: Telephone call to patient's home  / mobile number, no answer, left HIPAA compliant voicemail message, and requested call back.    Objective: Per KPN (Knowledge Performance Now, point of care tool) and chart review, patient hospitalized 10/08/2019 -10/12/2019 for elective endovascular stent treatment of Right ICA aneurysm, Carotid artery stenosis, status post Surpass Evolve embolization of RICA aneurysms, stroke.   Patient also has a history of Diabetes insipidus and Bipolar 1 disorder.      Assessment: Received [employer] EMMI Stroke [employer] Alert follow up referral on 10/17/2019 and 10/15/2019.  Red Flag Alert Trigger for Day # 3, patient answered yes to the following question: Feeling worse overall?    Red Flag Alert Trigger for Day #1, patient answered no to the following question: Scheduled a follow-up appointment?  [facility] EMMI follow up pending patient contact.     Plan: RNCM will send unsuccessful outreach letter, [facility] pamphlet, handout: Know Before You Go, will call patient for 2nd telephone outreach attempt within 4 business days, [facility] EMMI follow up, and proceed with case closure, within 10 business days if no return call.     Alyssa Brooks H. Gardiner Barefoot, BSN, CCM [facility] Care Management [facility] Telephonic CM Phone: [phone] Fax: [phone]

## 2019-10-18 ENCOUNTER — Encounter: Payer: Self-pay | Admitting: *Deleted

## 2019-10-18 ENCOUNTER — Other Ambulatory Visit: Payer: Self-pay | Admitting: *Deleted

## 2019-10-18 NOTE — Patient Outreach (Signed)
[facility]) Care Management  10/18/2019  Alyssa Brooks 1986-02-03 [id number]   Subjective: Telephone call to patient's home / mobile number, spoke with patient, and HIPAA verified.  Discussed [facility] Care Management [employer] EMMI Stroke Red Flag Alert  follow up, patient voiced understanding, and is in agreement to follow up.   Patient states she remember receiving EMMI automated calls.   Patient states is doing good, had follow up appointment with surgeon on 10/17/2019, appointment went well, being referred by neurosurgeon to endocrinologist, and appointment is scheduled for 11/28/2019.  States she will be following up with endocrinologist related to new diagnosis of diabetes insipidus.  Discussed importance of hospital follow up with primary MD, patient voices understanding, and states he will follow up as appropriate. States she  does not have a primary MD at this time, in the process of obtaining,  mother is finding her one, and recently had a physical at urgent care facility. Patient is aware that this RNCM can assist with finding primary MD if needed.   States she is still having bad headaches, left arm pain, taking Tylenol as needed, has been reported to surgeon, and  surgeon advised this is to be expected.   Patient states she is aware of signs/ symptoms to report, how to reach provider if needed after hours, when to go to ED, and / or call 911.   States her memory is foggy at times, does not recall if she has reported to MD, advised to report, patient voices understanding, is planning to report as soon as possible, and will inquire about possible neuro rehab.   States she  Is also forgetting to take medications at times. Discussed the following memory strategies: placing medications in a familiar place, somewhere you always go, on her night stand, near her toothbrush, and obtaining pill box.   Patient voices understanding of strategies, is in agreement,  and states she will give  them a try.  Patient states she is able to manage self care and has assistance as needed.  Patient voices understanding of medical diagnosis and treatment plan.   States she is accessing her [employer] benefits as needed via member services number on back of card.  Patient states she does not have any education material, EMMI follow up, care coordination, care management, disease monitoring, transportation, community resource, or pharmacy needs at this time.  States she is very appreciative of the follow up, is in agreement  to receive 1 additional follow up call to assess for further CM needs, and is in agreement to receive [facility] Care Management EMMI follow up calls as needed.     Objective: Per KPN (Knowledge Performance Now, point of care tool) and chart review, patient hospitalized 10/08/2019 -10/12/2019 for elective endovascular stent treatment of Right ICA aneurysm, Carotid artery stenosis, status post Surpass Evolve embolization of RICA aneurysms, stroke.   Patient also has a history of Diabetes insipidus and Bipolar 1 disorder.      Assessment: Received [employer] EMMI Stroke [employer] Alert follow up referral on 10/17/2019 and 10/15/2019.  Red Flag Alert Trigger for Day # 3, patient answered yes to the following question: Feeling worse overall?    Red Flag Alert Trigger for Day #1, patient answered no to the following question: Scheduled a follow-up appointment?   EMMI follow up completed and will follow up to assess further care management needs.     Plan: RNCM will call patient for telephone outreach attempt, within 21 business  days, EMMI follow up, to assess for further CM needs, and proceed with case closure, within 10 business days if no return call, after 3rd unsuccessful outreach call.     Secily Walthour H. Annia Friendly, BSN, [place] Management [facility] Telephonic CM Phone: [phone] Fax: [phone]

## 2019-10-19 ENCOUNTER — Inpatient Hospital Stay (HOSPITAL_COMMUNITY)
Admission: RE | Admit: 2019-10-19 | Discharge: 2019-10-21 | DRG: 315 | Disposition: A | Discharge status: Home / Self Care | Payer: BC Managed Care – PPO | Source: Other Acute Inpatient Hospital | Attending: Neurosurgery | Admitting: Neurosurgery

## 2019-10-19 DIAGNOSIS — I671 Cerebral aneurysm, nonruptured: Secondary | ICD-10-CM | POA: Diagnosis present

## 2019-10-19 DIAGNOSIS — F17210 Nicotine dependence, cigarettes, uncomplicated: Secondary | ICD-10-CM | POA: Diagnosis present

## 2019-10-19 DIAGNOSIS — Z881 Allergy status to other antibiotic agents status: Secondary | ICD-10-CM | POA: Diagnosis not present

## 2019-10-19 DIAGNOSIS — Z9109 Other allergy status, other than to drugs and biological substances: Secondary | ICD-10-CM

## 2019-10-19 DIAGNOSIS — Z880 Allergy status to penicillin: Secondary | ICD-10-CM

## 2019-10-19 DIAGNOSIS — E232 Diabetes insipidus: Secondary | ICD-10-CM

## 2019-10-19 DIAGNOSIS — F319 Bipolar disorder, unspecified: Secondary | ICD-10-CM | POA: Diagnosis present

## 2019-10-19 DIAGNOSIS — Z9114 Patient's other noncompliance with medication regimen: Secondary | ICD-10-CM

## 2019-10-19 DIAGNOSIS — I6521 Occlusion and stenosis of right carotid artery: Secondary | ICD-10-CM | POA: Diagnosis present

## 2019-10-19 DIAGNOSIS — T82898A Other specified complication of vascular prosthetic devices, implants and grafts, initial encounter: Secondary | ICD-10-CM | POA: Diagnosis present

## 2019-10-19 DIAGNOSIS — Z7902 Long term (current) use of antithrombotics/antiplatelets: Secondary | ICD-10-CM

## 2019-10-19 DIAGNOSIS — Z7982 Long term (current) use of aspirin: Secondary | ICD-10-CM | POA: Diagnosis not present

## 2019-10-19 DIAGNOSIS — Z79899 Other long term (current) drug therapy: Secondary | ICD-10-CM | POA: Diagnosis not present

## 2019-10-19 DIAGNOSIS — Z8679 Personal history of other diseases of the circulatory system: Secondary | ICD-10-CM

## 2019-10-19 DIAGNOSIS — Y838 Other surgical procedures as the cause of abnormal reaction of the patient, or of later complication, without mention of misadventure at the time of the procedure: Secondary | ICD-10-CM | POA: Diagnosis present

## 2019-10-19 DIAGNOSIS — R519 Headache, unspecified: Secondary | ICD-10-CM | POA: Diagnosis present

## 2019-10-19 LAB — HIV ANTIBODY (ROUTINE TESTING W REFLEX): HIV Screen 4th Generation wRfx: NONREACTIVE

## 2019-10-19 LAB — BASIC METABOLIC PANEL
Anion gap: 11 (ref 5–15)
BUN: 5 mg/dL — ABNORMAL LOW (ref 6–20)
CO2: 22 mmol/L (ref 22–32)
Calcium: 8.9 mg/dL (ref 8.9–10.3)
Chloride: 105 mmol/L (ref 98–111)
Creatinine, Ser: 0.78 mg/dL (ref 0.44–1.00)
GFR calc Af Amer: >60 mL/min (ref >60)
GFR calc non Af Amer: >60 mL/min (ref >60)
Glucose, Bld: 99 mg/dL (ref 70–99)
Potassium: 3.4 mmol/L — ABNORMAL LOW (ref 3.5–5.1)
Sodium: 138 mmol/L (ref 135–145)

## 2019-10-19 MED ORDER — SODIUM CHLORIDE 0.9% FLUSH
3.0000 mL | Freq: Two times a day (BID) | INTRAVENOUS | Status: DC
  Administered 2019-10-19 – 2019-10-20 (×2): 3 mL via INTRAVENOUS

## 2019-10-19 MED ORDER — ONDANSETRON HCL 4 MG/2ML IJ SOLN: 4.0000 mg | INTRAMUSCULAR | Status: DC | PRN

## 2019-10-19 MED ORDER — DEXAMETHASONE SODIUM PHOSPHATE 4 MG/ML IJ SOLN
4.0000 mg | Freq: Four times a day (QID) | INTRAMUSCULAR | Status: DC
  Administered 2019-10-19 – 2019-10-21 (×6): 4 mg via INTRAVENOUS
  Filled 2019-10-19 (×6): qty 1

## 2019-10-19 MED ORDER — LABETALOL HCL 5 MG/ML IV SOLN: 5.0000 mg | INTRAVENOUS | Status: DC | PRN

## 2019-10-19 MED ORDER — OXYCODONE HCL 5 MG PO TABS: 5.0000 mg | ORAL_TABLET | ORAL | Status: DC | PRN

## 2019-10-19 MED ORDER — POLYETHYLENE GLYCOL 3350 17 G PO PACK: 17.0000 g | PACK | Freq: Every day | ORAL | Status: DC | PRN

## 2019-10-19 MED ORDER — CLOPIDOGREL BISULFATE 75 MG PO TABS: 75.0000 mg | ORAL_TABLET | Freq: Every day | ORAL | Status: DC

## 2019-10-19 MED ORDER — DOCUSATE SODIUM 100 MG PO CAPS
100.0000 mg | ORAL_CAPSULE | Freq: Two times a day (BID) | ORAL | Status: DC
  Administered 2019-10-19 – 2019-10-20 (×2): 100 mg via ORAL
  Filled 2019-10-19 (×2): qty 1

## 2019-10-19 MED ORDER — SODIUM CHLORIDE 0.9% FLUSH
3.0000 mL | Freq: Two times a day (BID) | INTRAVENOUS | Status: DC
  Administered 2019-10-19: 3 mL via INTRAVENOUS

## 2019-10-19 MED ORDER — ACETAMINOPHEN 650 MG RE SUPP: 650.0000 mg | Freq: Four times a day (QID) | RECTAL | Status: DC | PRN

## 2019-10-19 MED ORDER — ACETAMINOPHEN 325 MG PO TABS: 650.0000 mg | ORAL_TABLET | Freq: Four times a day (QID) | ORAL | Status: DC | PRN

## 2019-10-19 MED ORDER — LORAZEPAM 2 MG/ML IJ SOLN
1.0000 mg | INTRAMUSCULAR | Status: DC | PRN
  Administered 2019-10-20: 2 mg via INTRAVENOUS
  Filled 2019-10-19: qty 1

## 2019-10-19 MED ORDER — ACETAMINOPHEN 500 MG PO TABS
1000.0000 mg | ORAL_TABLET | Freq: Three times a day (TID) | ORAL | Status: DC | PRN
  Administered 2019-10-21: 06:00:00 1000 mg via ORAL
  Filled 2019-10-19: qty 2

## 2019-10-19 MED ORDER — SODIUM CHLORIDE 0.9% FLUSH: 3.0000 mL | INTRAVENOUS | Status: DC | PRN

## 2019-10-19 MED ORDER — METHOCARBAMOL 1000 MG/10ML IJ SOLN
500.0000 mg | Freq: Four times a day (QID) | INTRAVENOUS | Status: DC | PRN
  Filled 2019-10-19: qty 5

## 2019-10-19 MED ORDER — BISACODYL 10 MG RE SUPP: 10.0000 mg | Freq: Every day | RECTAL | Status: DC | PRN

## 2019-10-19 MED ORDER — ONDANSETRON HCL 4 MG PO TABS: 4.0000 mg | ORAL_TABLET | Freq: Four times a day (QID) | ORAL | Status: DC | PRN

## 2019-10-19 MED ORDER — ZOLPIDEM TARTRATE 5 MG PO TABS: 5.0000 mg | ORAL_TABLET | Freq: Every evening | ORAL | Status: DC | PRN

## 2019-10-19 MED ORDER — DEXAMETHASONE SODIUM PHOSPHATE 10 MG/ML IJ SOLN
10.0000 mg | Freq: Once | INTRAMUSCULAR | Status: AC
  Administered 2019-10-19: 10 mg via INTRAVENOUS
  Filled 2019-10-19: qty 1

## 2019-10-19 MED ORDER — ONDANSETRON HCL 4 MG/2ML IJ SOLN: 4.0000 mg | Freq: Four times a day (QID) | INTRAMUSCULAR | Status: DC | PRN

## 2019-10-19 MED ORDER — SODIUM CHLORIDE 0.9 % IV SOLN: 250.0000 mL | INTRAVENOUS | Status: DC | PRN

## 2019-10-19 MED ORDER — ASPIRIN 325 MG PO TABS
325.0000 mg | ORAL_TABLET | Freq: Every day | ORAL | Status: DC
  Administered 2019-10-20 – 2019-10-21 (×2): 325 mg via ORAL
  Filled 2019-10-19 (×2): qty 1

## 2019-10-19 MED ORDER — FENTANYL CITRATE (PF) 100 MCG/2ML IJ SOLN
12.5000 ug | INTRAMUSCULAR | Status: DC | PRN
  Administered 2019-10-19 (×2): 50 ug via INTRAVENOUS
  Filled 2019-10-19 (×2): qty 2

## 2019-10-19 MED ORDER — MORPHINE SULFATE (PF) 2 MG/ML IV SOLN: 2.0000 mg | INTRAVENOUS | Status: DC | PRN

## 2019-10-19 MED ORDER — SODIUM CHLORIDE 0.9 % IV SOLN: INTRAVENOUS | Status: DC

## 2019-10-19 NOTE — H&P (Addendum)
Chief Complaint   Occluded cerebral stent  HPI   HPI: Alyssa Brooks is a 34 y.o. female s/p Surpass embolization of distal cavernous right carotid aneurysm 10/08/2019, complicated by in-stent thrombus and malposition requiring placement of Neuroform Atlas and DI with discharge 10/12/2019 who presented to [facility] with severe headache. A CTA was ordered revealing occluded flow diverting stent and right ICA. Patient was transferred to [facility] for further management and work up. Complain of severe right sided headache. Intermittent left arm N/T but no weakness. No real weakness or neuro deficit. Patient is on ASA and Plavix daily. She states she has been taking regularly but has a history of noncompliance.  Patient Active Problem List   Diagnosis Date Noted  . Carotid artery stenosis 10/08/2019  . Cerebral aneurysm, nonruptured 10/08/2019    PMH: Past Medical History:  Diagnosis Date  . Bipolar 1 disorder (HCC)   . Headache     PSH: Past Surgical History:  Procedure Laterality Date  . BREAST CYST EXCISION    . IR ANGIO INTRA EXTRACRAN SEL INTERNAL CAROTID BILAT MOD SED  08/02/2019  . IR ANGIO INTRA EXTRACRAN SEL INTERNAL CAROTID BILAT MOD SED  10/08/2019  . IR ANGIO VERTEBRAL SEL VERTEBRAL UNI R MOD SED  08/02/2019  . IR PATIENT EVAL TECH 0-60 MINS  10/09/2019  . IR TRANSCATH/EMBOLIZ  10/08/2019  . RADIOLOGY WITH ANESTHESIA Right 10/08/2019   Procedure: Surpass Evolve embolization of right carotid aneurysm;  Surgeon: Lisbeth Renshaw, MD;  Location: [facility] OR;  Service: Radiology;  Laterality: Right;  . TUBAL LIGATION      No medications prior to admission.    SH: Social History   Tobacco Use  . Smoking status: Current Every Day Smoker    Packs/day: 0.50    Types: Cigarettes  . Smokeless tobacco: Never Used  Substance Use Topics  . Alcohol use: Yes    Comment: rare  . Drug use: Yes    Frequency: 7.0 times per week    Types: Marijuana    MEDS: Prior to Admission medications    Medication Sig Start Date End Date Taking? Authorizing Provider  acetaminophen (TYLENOL) 500 MG tablet Take 1,000 mg by mouth every 8 (eight) hours as needed.    [provider]  aspirin 325 MG tablet Take 1 tablet (325 mg total) by mouth daily. 10/13/19   Bedelia Person, MD  clopidogrel (PLAVIX) 75 MG tablet Take 1 tablet (75 mg total) by mouth daily. 10/13/19   Bedelia Person, MD  desmopressin (DDAVP) 0.1 MG tablet Take 1 tablet (0.1 mg total) by mouth daily. 10/13/19   Bedelia Person, MD    ALLERGY: Allergies  Allergen Reactions  . Adhesive [Tape]     Paper tape pulls off skin, regular adhesive tape is fine  . Keflex [Cephalexin Monohydrate] Diarrhea and Nausea And Vomiting  . Penicillins Nausea And Vomiting    Did it involve swelling of the face/tongue/throat, SOB, or low BP? No Did it involve sudden or severe rash/hives, skin peeling, or any reaction on the inside of your mouth or nose? No Did you need to seek medical attention at a hospital or doctor's office? No When did it last happen?3 + years If all above answers are "NO", may proceed with cephalosporin use.     Social History   Tobacco Use  . Smoking status: Current Every Day Smoker    Packs/day: 0.50    Types: Cigarettes  . Smokeless tobacco: Never Used  Substance Use  Topics  . Alcohol use: Yes    Comment: rare     Family History  Problem Relation Age of Onset  . Cancer Mother        cervical  . Cancer Maternal Grandmother        uterine  . Hypertension Maternal Grandfather   . Diabetes Maternal Grandfather      ROS   Review of Systems  Constitutional: Negative.   HENT: Negative.   Eyes: Negative.  Negative for blurred vision, double vision and photophobia.  Respiratory: Negative.   Cardiovascular: Negative.   Gastrointestinal: Negative.  Negative for nausea and vomiting.  Genitourinary: Negative.   Musculoskeletal: Negative.   Neurological: Positive for tingling (LUE  intermittently) and headaches. Negative for dizziness, tremors, sensory change, speech change, focal weakness and weakness.    Exam   There were no vitals filed for this visit. General appearance: WDWN, NAD GCS 15 Eyes: No scleral injection Cardiovascular: Regular rate and rhythm without murmurs, rubs, gallops. No edema or variciosities. Distal pulses normal. Pulmonary: Effort normal, non-labored breathing Musculoskeletal:     Muscle tone upper extremities: Normal    Muscle tone lower extremities: Normal    Motor exam: Upper Extremities Deltoid Bicep Tricep Grip  Right 5/5 5/5 5/5 5/5  Left 5/5 5/5 5/5 5/5   Lower Extremity IP Quad PF DF EHL  Right 5/5 5/5 5/5 5/5 5/5  Left 5/5 5/5 5/5 5/5 5/5   Neurological Mental Status:    - Patient is awake, alert, oriented to person, place, month, year, and situation    - Patient is able to give a clear and coherent history.    - No signs of aphasia or neglect Cranial Nerves    - II: Visual Fields are full. PERRL    - III/IV/VI: EOMI without ptosis or diploplia.     - V: Facial sensation is grossly normal    - VII: Facial movement is symmetric.     - VIII: hearing is intact to voice    - X: Uvula elevates symmetrically    - XI: Shoulder shrug is symmetric.    - XII: tongue is midline without atrophy or fasciculations.  Sensory: Sensation grossly intact to LT Plantars   - Toes are downgoing bilaterally. Cerebellar    - FNF and HKS are intact bilaterally  Results - Imaging/Labs   No results found for this or any previous visit (from the past 48 hour(s)).  No results found.  IMAGING: CT ANGIOGRAPHY HEAD  TECHNIQUE: Multidetector CT imaging of the head was performed using the standard protocol during bolus administration of intravenous contrast. Multiplanar CT image reconstructions and MIPs were obtained to evaluate the vascular anatomy.  CONTRAST: 80 mL Omnipaque 350  COMPARISON: Head CT 10/19/2019 and 10/09/2019. Head  CTA 10/09/2019.  FINDINGS: CT HEAD  Brain: Compared to 10/09/2019, subarachnoid hemorrhage has resolved, and no acute intracranial hemorrhage is identified. Hypodensity in the anterior left frontal lobe corresponding to the ACA/MCA border zone infarct has become less apparent. No new infarct, mass, midline shift, or extra-axial fluid collection is identified. The ventricles are normal in size.  Vascular: No hyperdense vessel.  Skull: No fracture or suspicious osseous lesion.  Sinuses: Mild bilateral ethmoid air cell mucosal thickening. Clear mastoid air cells.  Orbits: Unremarkable.  CTA HEAD  Anterior circulation: There is new occlusion of the right ICA beginning in the proximal to mid cervical segment and extending to the flow diverting stent which is occluded. There is reconstitution of the  right ICA terminus. No aneurysm filling is identified. The left ICA is widely patent. ACAs and MCAs are patent without evidence of proximal branch occlusion. The right ACA is dominant. There is mild asymmetric irregularity of right MCA branch vessels.  Posterior circulation: The visualized distal vertebral arteries are widely patent to the basilar. The basilar artery is widely patent. There is a moderate-sized left posterior communicating artery. Both PCAs are patent with similar appearance of mild asymmetric left PCA irregularity without flow limiting proximal stenosis. No aneurysm is identified.  Venous sinuses: Patent.  Anatomic variants: None of significance.  IMPRESSION: 1. New occlusion of the right ICA including the flow diverting stent with distal reconstitution of the carotid terminus. 2. Evolving anterior left frontal lobe infarct. 3. No evidence of acute intracranial hemorrhage or acute infarct.   Impression/Plan   34 y.o. female with severe headache, found to have new occlusion of right ICA including flow diverting stent. She is neurologically intact. She reports  taking ASA and plavix daily as instructed but does have a history of noncompliance. Reviewed with Dr Conchita Paris. No indication for acute NS intervention at present. Will admit for monitoring.   Occluded R ICA and flow diverting stent - frequent neuro checks - Start decadron 10mg  now, then 4mg  q 6 hours - ASA and Plavix daily  History of DI - Monitor urinary output - trend Na - can restart DDVAP as necessary  Bipolar disorder - Not on daily meds - Ativan prn  , PA-C [facility]

## 2019-10-20 LAB — GLUCOSE, CAPILLARY
Glucose-Capillary: 144 mg/dL — ABNORMAL HIGH (ref 70–99)
Glucose-Capillary: 140 mg/dL — ABNORMAL HIGH (ref 70–99)
Glucose-Capillary: 138 mg/dL — ABNORMAL HIGH (ref 70–99)
Glucose-Capillary: 131 mg/dL — ABNORMAL HIGH (ref 70–99)

## 2019-10-20 MED ORDER — CHLORHEXIDINE GLUCONATE CLOTH 2 % EX PADS
6.0000 | MEDICATED_PAD | Freq: Every day | CUTANEOUS | Status: DC
  Administered 2019-10-20: 6 via TOPICAL

## 2019-10-20 NOTE — Progress Notes (Signed)
PT Cancellation Note  Patient Details Name: Alyssa Brooks MRN: [id number] DOB: 03/14/1986   Cancelled Treatment:    Reason Eval/Treat Not Completed: PT screened, no needs identified, will sign off  Spoke with RN and patient. She "danced" her way into the bathroom to take a shower and states for balance and mobility she is back to her baseline.  Of note, she reports continued LUE symptoms. Feel she can benefit from OT consult for ?OPOT referral.   Jerolyn Center, PT Pager [phone]   Zena Amos 10/20/2019, 1:46 PM

## 2019-10-20 NOTE — Progress Notes (Signed)
  NEUROSURGERY PROGRESS NOTE   No issues overnight. HA and neck pain significantly improved this am.  EXAM:  BP (!) 142/101   Pulse 82   Temp 99 F (37.2 C) (Oral)   Resp (!) 21   SpO2 95%   Awake, alert, oriented  Speech fluent, appropriate  CN grossly intact  5/5 BUE/BLE   IMPRESSION:  34 y.o. female s/p embolization of RICA aneurysm with subacute stent thrombosis, neurologically intact with collateral circulation.  PLAN: - Cont steroids - Will cont to monitor, if stable can consider d/c tomorrow am. - Cont ASA, I have d/c'ed plavix

## 2019-10-21 LAB — GLUCOSE, CAPILLARY: Glucose-Capillary: 155 mg/dL — ABNORMAL HIGH (ref 70–99)

## 2019-10-21 MED ORDER — METHYLPREDNISOLONE 4 MG PO TBPK: ORAL_TABLET | ORAL | 0 refills | Status: DC

## 2019-10-21 MED ORDER — OXYCODONE-ACETAMINOPHEN 7.5-325 MG PO TABS: 1.0000 | ORAL_TABLET | ORAL | 0 refills | Status: DC | PRN

## 2019-10-21 NOTE — Progress Notes (Signed)
  NEUROSURGERY PROGRESS NOTE   No issues overnight.  Drastic improvement in HA Feels ready to go home  EXAM:  BP 131/78   Pulse 74   Temp 98.5 F (36.9 C)   Resp (!) 22   SpO2 96%   Awake, alert, oriented  Speech fluent, appropriate  CN grossly intact  5/5 BUE/BLE   PLAN Doing well D/c home

## 2019-10-21 NOTE — Evaluation (Signed)
Occupational Therapy Evaluation Patient Details Name: Alyssa Brooks MRN: [id number] DOB: Jan 02, 1986 Today's Date: 10/21/2019    History of Present Illness 34 y.o. female presenting with severe right sided HA. 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 CTA demonstrates occlusion of the RICA from the mid-cervical region through the supraclinoid segment with reconstitution of the distal segments. PMH including bipolar 1 disorder.    Clinical Impression   PTA, pt was living with her three children and reports she was performing BADLs; mother was assisting with IADLs and driving. Currently, pt performing ADLs and functional mobility at Supervision level. Pt presenting with decreased cognition, safety, and functional use of LUE (dominat hand). Pt reports that during her time at home (between last admission and this one) she was having decreased memory, "thinking", and sensation at LUE. Pt agreeable to neuro OP OT to address LUE deficits and cognition. Answered all pt questions. Answered all questions in preparation for dc later today. Recommend dc home with follow up at neuro OP OT.     Follow Up Recommendations  Outpatient OT;Supervision/Assistance - 24 hour(Neuro OP OT)    Equipment Recommendations  None recommended by OT    Recommendations for Other Services       Precautions / Restrictions Precautions Precautions: Fall Precaution Comments: monitor BP Restrictions Weight Bearing Restrictions: No      Mobility Bed Mobility Overal bed mobility: Modified Independent                Transfers Overall transfer level: Needs assistance Equipment used: None Transfers: Sit to/from Stand Sit to Stand: Supervision              Balance Overall balance assessment: Needs assistance Sitting-balance support: Feet supported;No upper extremity supported Sitting balance-Leahy Scale: Good     Standing balance support: No upper extremity  supported Standing balance-Leahy Scale: Good                             ADL either performed or assessed with clinical judgement   ADL Overall ADL's : Needs assistance/impaired Eating/Feeding: Set up;Supervision/ safety;Sitting   Grooming: Sitting;Supervision/safety;Set up   Upper Body Bathing: Supervision/ safety;Set up;Sitting   Lower Body Bathing: Supervison/ safety;Set up;Sit to/from stand   Upper Body Dressing : Supervision/safety;Set up;Sitting   Lower Body Dressing: Supervision/safety;Set up;Sit to/from stand   Toilet Transfer: Supervision/safety;Set up;Ambulation(simulated to recliner)           Functional mobility during ADLs: Supervision/safety General ADL Comments: Pt continues to present with decreased cognition and awareness. Reports poor sensation and strength at LUE (dominant hand). Agreeable to neru OP OT     Vision Baseline Vision/History: No visual deficits Patient Visual Report: No change from baseline       Perception     Praxis      Pertinent Vitals/Pain Pain Assessment: Faces Faces Pain Scale: Hurts a little bit Pain Location: HA Pain Descriptors / Indicators: Guarding Pain Intervention(s): Monitored during session;Limited activity within patient's tolerance;Repositioned     Hand Dominance Left   Extremity/Trunk Assessment Upper Extremity Assessment Upper Extremity Assessment: LUE deficits/detail LUE Deficits / Details: LUE weaker than right with poor grasp strength and gross strength. Decreased coorindation as seen during finger opposition. Reports tingling/painful stimuli from shoulder to fingers. Reports intact light touch LUE Coordination: decreased fine motor   Lower Extremity Assessment Lower Extremity Assessment: Overall WFL for tasks assessed   Cervical / Trunk Assessment Cervical /  Trunk Assessment: Normal   Communication Communication Communication: No difficulties   Cognition Arousal/Alertness:  Awake/alert Behavior During Therapy: Impulsive Overall Cognitive Status: Impaired/Different from baseline Area of Impairment: Attention;Safety/judgement;Awareness;Problem solving                   Current Attention Level: Selective     Safety/Judgement: Decreased awareness of safety Awareness: Emergent Problem Solving: Slow processing;Requires verbal cues;Difficulty sequencing General Comments: This OT familiar with pt from last admission. Pt presenting with decreased attention and moving impulsively. Agreeable to therapy. Pt more aware of deficits compared to last admittion. However, continues to present with decreased problem solving, attention, and awareness. Pt able to recall information from last admission and reason for this admission. Pt reports she continues she feels "slow thinking" and "forgetful". 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   General Comments       Exercises     Shoulder Instructions      Home Living Family/patient expects to be discharged to:: Private residence Living Arrangements: Children(Three children) Available Help at Discharge: Family;Other (Comment);Available 24 hours/day(significant other, mother) Type of Home: House Home Access: Stairs to enter [employer] of Steps: 5 Entrance Stairs-Rails: (need to clarify if railings available) Home Layout: One level     Bathroom Shower/Tub: Walk-in shower;Tub/shower unit   Bathroom Toilet: Standard     Home Equipment: None          Prior Functioning/Environment Level of Independence: Independent        Comments: working as Contractor Problem List: Decreased range of motion;Decreased activity tolerance;Impaired balance (sitting and/or standing);Decreased cognition;Decreased safety  awareness;Decreased knowledge of use of DME or AE;Decreased knowledge of precautions;Pain      OT Treatment/Interventions: Self-care/ADL training;Therapeutic exercise;Energy conservation;DME and/or AE instruction;Therapeutic activities;Patient/family education    OT Goals(Current goals can be found in the care plan section) Acute Rehab OT Goals Patient Stated Goal: To go home OT Goal Formulation: With patient Time For Goal Achievement: 11/08/19 Potential to Achieve Goals: Good  OT Frequency: Min 2X/week   Barriers to D/C:            Co-evaluation              AM-PAC OT "6 Clicks" Daily Activity     Outcome Measure Help from another person eating meals?: A Little Help from another person taking care of personal grooming?: A Little Help from another person toileting, which includes using toliet, bedpan, or urinal?: A Lot Help from another person bathing (including washing, rinsing, drying)?: A Lot Help from another person to put on and taking off regular upper body clothing?: A Little Help from another person to put on and taking off regular lower body clothing?: A Lot 6 Click Score: 15   End of Session Nurse Communication: Mobility status  Activity Tolerance: Patient tolerated treatment well Patient left: in chair;with call bell/phone within reach  OT Visit Diagnosis: Unsteadiness on feet (R26.81);Other abnormalities of gait and mobility (R26.89);Muscle weakness (generalized) (M62.81);Pain Pain - part of body: (Cath site. headache)                Time: 2458-0998 OT Time Calculation (min): 10 min Charges:  OT General Charges $OT Visit: 1 Visit OT Evaluation $OT Eval Moderate Complexity: 1 Mod  Shreeya Recendiz  Tollie Canada MSOT, OTR/L Acute Rehab Pager: [phone] Office: [place] 10/21/2019, 10:26 AM

## 2019-10-21 NOTE — Discharge Summary (Signed)
Physician Discharge Summary  Patient ID: Alyssa Brooks MRN: [id number] DOB/AGE: December 19, 1985 34 y.o.  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 female who presented to [facility] ED on 10/19/2019 with a severe headache. She underwent CTA which revealed occlusion of cerebral stent of recently treated aneurysm. She was neurologically intact and trasnferred to [facility] for evaluation. She was monitored for several days and improved each day to the point her HA had largely resolved. There was no need for surgery given neuro status - patient appeared to have good contrallateral flow with ACOM.  At time of discharge, pain was well controlled, ambulating with Pt/OT, tolerating po, voiding normal. Ready for discharge. She will remain solely on ASA/  Treatments: Surgery - none  Discharge Exam: Blood pressure 131/78, pulse 74, temperature 98.5 F (36.9 C), resp. rate (!) 22, SpO2 96 %. Awake, alert, oriented Speech fluent, appropriate CN grossly intact 5/5 BUE/BLE Wound c/d/i  Disposition: Discharge disposition: 01-Home or Self Care       Discharge Instructions     Call MD for:  difficulty breathing, headache or visual disturbances   Complete by: As directed    Call MD for:  persistant dizziness or light-headedness   Complete by: As directed    Call MD for:  redness, tenderness, or signs of infection (pain, swelling, redness, odor or green/yellow discharge around incision site)   Complete by: As directed    Call MD for:  severe uncontrolled pain   Complete by: As directed    Call MD for:  temperature >100.4   Complete by: As directed    Increase activity slowly   Complete by: As directed    May shower / Bathe   Complete by: As directed    In 24 hours.  Okay to wash wound with warm soapy water. Avoid scrubbing the wound. Pat dry.      Allergies as of 10/21/2019       Reactions   Adhesive [tape]    Paper tape pulls off skin, regular adhesive tape is fine   Keflex [cephalexin Monohydrate] Diarrhea, Nausea And Vomiting   Penicillins Nausea And Vomiting   Did it involve swelling of the face/tongue/throat, SOB, or low BP? No Did it involve sudden or severe rash/hives, skin peeling, or any reaction on the inside of your mouth or nose? No Did you need to seek medical attention at a hospital or doctor's office? No When did it last happen?      3 + years If all above answers are "NO", may proceed with cephalosporin use.        Medication List     STOP taking these medications    clopidogrel 75 MG tablet Commonly known as: PLAVIX   desmopressin 0.1 MG tablet Commonly known as: DDAVP       TAKE these medications    acetaminophen 500 MG tablet Commonly known as: TYLENOL Take 1,000 mg by mouth every 8 (eight) hours as needed.   aspirin 325 MG tablet Take 1 tablet (325 mg total) by mouth daily.   methylPREDNISolone 4 MG Tbpk tablet Commonly known as: Medrol Take according to package insert   oxyCODONE-acetaminophen 7.5-325 MG tablet Commonly known as: Percocet Take 1 tablet by mouth every 4 (four) hours as needed for severe pain.  Follow-up Information     Lisbeth Renshaw, MD. Schedule an appointment as soon as possible for a visit in 3 week(s).   Specialty: Neurosurgery Contact information: [address] [place] [address] [phone]            Signed: Alyson Ingles 10/21/2019, 8:45 AM

## 2019-10-21 NOTE — Progress Notes (Signed)
Pt education completed. See d/c summary. PIV d/c'd by pt. Site assessed. Site WNL. Pt transported via WC and d/c'd with family.

## 2019-11-06 ENCOUNTER — Other Ambulatory Visit: Payer: Self-pay | Admitting: *Deleted

## 2019-11-06 ENCOUNTER — Ambulatory Visit: Payer: Self-pay | Admitting: *Deleted

## 2019-11-06 NOTE — Patient Outreach (Addendum)
[facility]) Care Management  11/06/2019  Alyssa Brooks Mar 10, 1986 [id number]   Subjective: Telephone call to patient's home  / mobile number, no answer, left HIPAA compliant voicemail message, and requested call back.    Objective:Per KPN (Knowledge Performance Now, point of care tool) and chart review, patient hospitalized 10/19/2019 - 10/21/2019 for Cerebral aneurysm without rupture, Diabetes insipidus.  Patient hospitalized 10/08/2019 -10/12/2019 forelective endovascular stent treatment of RightICA aneurysm,Carotid artery stenosis, status postSurpass Evolve embolization of RICA aneurysms, stroke. Patient also has a history of Diabetes insipidusandBipolar 1 disorder.     Assessment: Received [employer] EMMI Stroke [employer] Alert follow up referral on 10/21/2019, 10/17/2019 and 10/15/2019. Red Flag Alert Triggers for Day #6, patient answered yes to the following questions: Feeling worse overall?   New problems walking/talking/speaking/seeing?    Red Flag Alert Trigger for Day # 3, patient answered yes to the following question: Feeling worse overall?Red Flag Alert Trigger for Day #1, patient answered no to the following question:Scheduled a follow-up appointment? EMMI follow up for Day #1  & # 3 completed.  EMMI follow up pending patient contact,  for Day #6 Red Flag Alert, and will follow up to assess further care management needs.     Plan:RNCM will call patient for 2nd telephone outreach attempt, within 4  business days, EMMI follow up, to assess for further CM needs, and proceed with case closure, within 10 business days if no return call, after 3rd unsuccessful outreach call.    Alyssa Brooks H. Gardiner Barefoot, BSN, CCM [facility] Care Management [facility] Telephonic CM Phone: [phone] Fax: [phone]

## 2019-11-07 ENCOUNTER — Other Ambulatory Visit: Payer: Self-pay | Admitting: *Deleted

## 2019-11-07 NOTE — Patient Outreach (Signed)
[facility]) Care Management  11/07/2019  Alyssa Brooks [birth year] [id number]   Subjective: Telephone call to patient's home  / mobile number, no answer, left HIPAA compliant voicemail message, and requested call back.    Objective:Per KPN (Knowledge Performance Now, point of care tool) and chart review, patient hospitalized 10/19/2019 - 10/21/2019 for Cerebral aneurysm without rupture, Diabetes insipidus.  Patient hospitalized 10/08/2019 -10/12/2019 forelective endovascular stent treatment of RightICA aneurysm,Carotid artery stenosis, status postSurpass Evolve embolization of RICA aneurysms, stroke. Patient also has a history of Diabetes insipidusandBipolar 1 disorder.     Assessment: Received [employer] EMMI Stroke [employer] Alert follow up referral on 10/21/2019, 10/17/2019 and 10/15/2019. Red Flag Alert Triggers for Day #6, patient answered yes to the following questions: Feeling worse overall?   New problems walking/talking/speaking/seeing?    Red Flag Alert Trigger for Day # 3, patient answered yes to the following question: Feeling worse overall?Red Flag Alert Trigger for Day #1, patient answered no to the following question:Scheduled a follow-up appointment?EMMI follow up for Day #1  & # 3 completed.  EMMI follow up pending patient contact,  for Day #6 Red Flag Alert, and will follow up to assess further care management needs.     Plan:RNCM will call patient for 3rd telephone outreach attempt, within4 business days, EMMI follow up, to assess for further CM needs, and proceed with case closure, within 10 business days if no return call, after 3rd unsuccessful outreach call.    Inola Lisle H. Gardiner Barefoot, BSN, CCM [facility] Care Management [facility] Telephonic CM Phone: [phone] Fax: [phone]

## 2019-11-12 ENCOUNTER — Other Ambulatory Visit: Payer: Self-pay | Admitting: *Deleted

## 2019-11-12 NOTE — Patient Outreach (Addendum)
[facility]) Care Management  11/12/2019  Alyssa Brooks 12-Jul-1985 [id number]   Subjective:Telephone call to patient's home / mobile number, no answer, left HIPAA compliant voicemail message, and requested call back. Telephone call from  patient's home / mobile number, spoke with patient, and HIPAA verified.  States she remembers speaking with this RNCM in the past.  Patient states she was hospitalized  ( 10/19/2019 - 10/21/2019) for blocked stent, was having severe headaches, headaches have now improved, completed steroid treatment, slight numbness on the left side remains, feels different when things touch the left side of her body, and she also has a follow up appointment with neurosurgeon (Dr. Conchita Paris) on 11/14/2019. Patient states she is aware of signs/ symptoms to report, how to reach provider if needed after hours, when to go to ED, and / or call 911.  Patient states she has not received a call back regarding appointment for outpatient neuro rehab occupational therapy evaluation and treatment, requested RNCM assistance. Marland Kitchen  RNCM initiated a conference call with patient, this RNCM, and Victorino Dike at [facility], appointment scheduled for 11/21/2019 at 10:15am.   Victorino Dike disconnected from call, [facility] and patient, continue with EMMI follow up.   Patient verbally given contact number for neuro rehab [phone]) and facility address.   Patient states she has an appointment with endocrinologist on 11/28/2019 to follow up on diabetes insipidus diagnosis.  Discussed importance of hospital follow up with primary MD, patient voices understanding, and states she will follow up as appropriate.     States she has not set a follow up with primary MD to establish care, had forgotten with everything going on,  and will remind mother to set up for her as soon as possible.   Patient states she does not have any education material, EMMI follow up, care coordination, care  management, disease monitoring, transportation, community resource, or pharmacy needs at this time.  States she is very appreciative of the follow up, is in agreement  to receive 1 additional follow up call to assess for further CM needs, and is in agreement to receive [facility] Care Management EMMI follow up calls as needed.   States this RNCM has been very helpful and could not have gotten therapy appointment set up without RNCM's assistance.     Objective:Per KPN (Knowledge Performance Now, point of care tool) and chart review,patient hospitalized 10/19/2019 - 10/21/2019 forCerebral aneurysm without rupture,Diabetes insipidus. Patient hospitalized 10/08/2019 -10/12/2019 forelective endovascular stent treatment of RightICA aneurysm,Carotid artery stenosis, status postSurpass Evolve embolization of RICA aneurysms, stroke. Patient also has a history of Diabetes insipidusandBipolar 1 disorder.     Assessment: Received [employer] EMMI Stroke [employer] Alert follow up referral on4/19/2021,10/17/2019 and 10/15/2019. Red Flag Alert Triggers for Day #6, patient answered yes to the following questions:Feeling worse overall?New problems walking/talking/speaking/seeing?Red Flag Alert Trigger for Day # 3, patient answered yes to the following question: Feeling worse overall?Red Flag Alert Trigger for Day #1, patient answered no to the following question:Scheduled a follow-up appointment?EMMI follow upfor Day #1 &# 3 completed. EMMI follow up completed, for Day #6 Red Flag Alert, and will follow up to assess further care management needs.     Plan:RNCM will call patient for telephone outreach attempt, within 14 business days, EMMI follow up, to assess for further CM needs, and proceed with case closure, within 10 business days if no return call, after 3rd unsuccessful call.      Leidy Massar H. Excell Seltzer Charity fundraiser, BSN, CCM [facility]  Care Management [facility] Telephonic CM Phone:  [phone] Fax: [phone]

## 2019-11-21 ENCOUNTER — Ambulatory Visit: Payer: BC Managed Care – PPO | Admitting: Occupational Therapy

## 2019-11-22 ENCOUNTER — Other Ambulatory Visit: Payer: Self-pay | Admitting: *Deleted

## 2019-11-22 NOTE — Patient Outreach (Signed)
[facility]) Care Management  11/22/2019  Alyssa Brooks 06/15/1986 [id number]   Subjective: Telephone call to patient's home  / mobile number, no answer, left HIPAA compliant voicemail message, and requested call back.   Objective:Per KPN (Knowledge Performance Now, point of care tool) and chart review,patient hospitalized 10/19/2019 - 10/21/2019 forCerebral aneurysm without rupture,Diabetes insipidus. Patient hospitalized 10/08/2019 -10/12/2019 forelective endovascular stent treatment of RightICA aneurysm,Carotid artery stenosis, status postSurpass Evolve embolization of RICA aneurysms, stroke. Patient also has a history of Diabetes insipidusandBipolar 1 disorder.     Assessment: Received [employer] EMMI Stroke [employer] Alert follow up referral on4/19/2021,10/17/2019 and 10/15/2019. Red Flag Alert Triggers for Day #6, patient answered yes to the following questions:Feeling worse overall?New problems walking/talking/speaking/seeing?Red Flag Alert Trigger for Day # 3, patient answered yes to the following question: Feeling worse overall?Red Flag Alert Trigger for Day #1, patient answered no to the following question:Scheduled a follow-up appointment?EMMI follow upfor Day #1 &# 3 completed. EMMI follow up completed, for Day #6 Red Flag Alert, and will follow up to assess further care management needs.     Plan:RNCM will call patient for 2nd telephone outreach attempt, within 4 business days, EMMI follow up, to assess for further CM needs, and proceed with case closure, within 10 business days if no return call, after 3rd unsuccessful call.     Ceira Hoeschen H. Gardiner Barefoot, BSN, CCM [facility] Care Management [facility] Telephonic CM Phone: [phone] Fax: [phone]

## 2019-11-26 ENCOUNTER — Other Ambulatory Visit: Payer: Self-pay | Admitting: *Deleted

## 2019-11-26 NOTE — Patient Outreach (Addendum)
[facility]) Care Management  11/26/2019  Alyssa Brooks 04/02/1986 [id number]   Subjective: Telephone call to patient's home / mobile number times 2, spoke with patient, and HIPAA verified.  States she is feeling much better, headaches have improved with a caffeine pill, insurance does not cover the pill, is paying out of pocket for pill, and is able to afford.  States she was having some cold like symptoms on 11/13/2019, no fever, had to cancel 11/14/2019 occupational therapy appointment, and is planning to call tomorrow to reschedule.   States she is also planning to call to reschedule 11/28/2019 endocrinology appointment due to a conflict with her son's appointment.  States she and mother had forgotten to follow up on obtaining primary, appreciated the reminder, she will call to follow up on 11/27/2019.   Patient states she does not have any education material, EMMI follow up, care coordination, care management, disease monitoring, transportation, community resource, or pharmacy needs at this time. States she is very appreciative of the follow up, this RNCM has been great, patient aware 30 day EMMI automated calls are completed, and has no further care management needs at this time.    Objective:Per KPN (Knowledge Performance Now, point of care tool) and chart review,patient hospitalized 10/19/2019 - 10/21/2019 forCerebral aneurysm without rupture,Diabetes insipidus. Patient hospitalized 10/08/2019 -10/12/2019 forelective endovascular stent treatment of RightICA aneurysm,Carotid artery stenosis, status postSurpass Evolve embolization of RICA aneurysms, stroke. Patient also has a history of Diabetes insipidusandBipolar 1 disorder.     Assessment: Received [employer] EMMI Stroke [employer] Alert follow up referral on4/19/2021,10/17/2019 and 10/15/2019. Red Flag Alert Triggers for Day #6, patient answered yes to the following questions:Feeling worse overall?New  problems walking/talking/speaking/seeing?Red Flag Alert Trigger for Day # 3, patient answered yes to the following question: Feeling worse overall?Red Flag Alert Trigger for Day #1, patient answered no to the following question:Scheduled a follow-up appointment?EMMI follow upfor Day #1 &# 3 completed. EMMI follow upcompleted, for Day #6 Red Flag Alert, and no  further care management needs.     Plan:RNCM will complete case closure due to follow up completed / no care management needs.       [name] H. Gardiner Barefoot, BSN, CCM [facility] Care Management [facility] Telephonic CM Phone: [phone] Fax: [phone]

## 2020-05-19 ENCOUNTER — Emergency Department (HOSPITAL_COMMUNITY)
Admission: EM | Admit: 2020-05-19 | Discharge: 2020-05-19 | Disposition: A | Discharge status: Home / Self Care | Payer: BC Managed Care – PPO | Attending: Emergency Medicine | Admitting: Emergency Medicine

## 2020-05-19 ENCOUNTER — Emergency Department (HOSPITAL_COMMUNITY): Payer: BC Managed Care – PPO

## 2020-05-19 ENCOUNTER — Other Ambulatory Visit: Payer: Self-pay

## 2020-05-19 ENCOUNTER — Encounter (HOSPITAL_COMMUNITY): Payer: Self-pay

## 2020-05-19 DIAGNOSIS — F17210 Nicotine dependence, cigarettes, uncomplicated: Secondary | ICD-10-CM | POA: Insufficient documentation

## 2020-05-19 DIAGNOSIS — M549 Dorsalgia, unspecified: Secondary | ICD-10-CM | POA: Diagnosis not present

## 2020-05-19 DIAGNOSIS — Z7982 Long term (current) use of aspirin: Secondary | ICD-10-CM | POA: Diagnosis not present

## 2020-05-19 DIAGNOSIS — E232 Diabetes insipidus: Secondary | ICD-10-CM | POA: Diagnosis not present

## 2020-05-19 DIAGNOSIS — J40 Bronchitis, not specified as acute or chronic: Secondary | ICD-10-CM

## 2020-05-19 DIAGNOSIS — R197 Diarrhea, unspecified: Secondary | ICD-10-CM | POA: Diagnosis not present

## 2020-05-19 DIAGNOSIS — J209 Acute bronchitis, unspecified: Secondary | ICD-10-CM | POA: Diagnosis not present

## 2020-05-19 DIAGNOSIS — Z20822 Contact with and (suspected) exposure to covid-19: Secondary | ICD-10-CM | POA: Diagnosis not present

## 2020-05-19 DIAGNOSIS — R1031 Right lower quadrant pain: Secondary | ICD-10-CM | POA: Diagnosis not present

## 2020-05-19 LAB — COMPREHENSIVE METABOLIC PANEL
ALT: 11 U/L (ref 0–44)
AST: 17 U/L (ref 15–41)
Albumin: 3.7 g/dL (ref 3.5–5.0)
Alkaline Phosphatase: 53 U/L (ref 38–126)
Anion gap: 11 (ref 5–15)
BUN: 8 mg/dL (ref 6–20)
CO2: 18 mmol/L — ABNORMAL LOW (ref 22–32)
Calcium: 9.1 mg/dL (ref 8.9–10.3)
Chloride: 106 mmol/L (ref 98–111)
Creatinine, Ser: 0.79 mg/dL (ref 0.44–1.00)
GFR, Estimated: >60 mL/min (ref >60)
Glucose, Bld: 89 mg/dL (ref 70–99)
Potassium: 4 mmol/L (ref 3.5–5.1)
Sodium: 135 mmol/L (ref 135–145)
Total Bilirubin: 0.2 mg/dL — ABNORMAL LOW (ref 0.3–1.2)
Total Protein: 6.8 g/dL (ref 6.5–8.1)

## 2020-05-19 LAB — CBC
HCT: 38.6 % (ref 36.0–46.0)
Hemoglobin: 12.1 g/dL (ref 12.0–15.0)
MCH: 27.3 pg (ref 26.0–34.0)
MCHC: 31.3 g/dL (ref 30.0–36.0)
MCV: 87.1 fL (ref 80.0–100.0)
Platelets: 285 10*3/uL (ref 150–400)
RBC: 4.43 MIL/uL (ref 3.87–5.11)
RDW: 15.1 % (ref 11.5–15.5)
WBC: 11.5 10*3/uL — ABNORMAL HIGH (ref 4.0–10.5)
nRBC: 0 % (ref 0.0–0.2)

## 2020-05-19 LAB — I-STAT BETA HCG BLOOD, ED (MC, WL, AP ONLY): I-stat hCG, quantitative: <5 m[IU]/mL (ref <5)

## 2020-05-19 LAB — TROPONIN I (HIGH SENSITIVITY): Troponin I (High Sensitivity): 4 ng/L (ref <18)

## 2020-05-19 LAB — LIPASE, BLOOD: Lipase: 26 U/L (ref 11–51)

## 2020-05-19 LAB — RESPIRATORY PANEL BY RT PCR (FLU A&B, COVID)
Influenza A by PCR: NEGATIVE
Influenza B by PCR: NEGATIVE
SARS Coronavirus 2 by RT PCR: NEGATIVE

## 2020-05-19 MED ORDER — ALBUTEROL SULFATE HFA 108 (90 BASE) MCG/ACT IN AERS
2.0000 | INHALATION_SPRAY | RESPIRATORY_TRACT | Status: DC | PRN
  Administered 2020-05-19: 2 via RESPIRATORY_TRACT
  Filled 2020-05-19: qty 6.7

## 2020-05-19 MED ORDER — IOHEXOL 300 MG/ML  SOLN
100.0000 mL | Freq: Once | INTRAMUSCULAR | Status: AC | PRN
  Administered 2020-05-19: 100 mL via INTRAVENOUS

## 2020-05-19 MED ORDER — PREDNISONE 20 MG PO TABS: 40.0000 mg | ORAL_TABLET | Freq: Every day | ORAL | 0 refills | Status: DC

## 2020-05-19 MED ORDER — ACETAMINOPHEN 500 MG PO TABS
1000.0000 mg | ORAL_TABLET | Freq: Once | ORAL | Status: AC
  Administered 2020-05-19: 1000 mg via ORAL
  Filled 2020-05-19: qty 2

## 2020-05-19 MED ORDER — AEROCHAMBER PLUS FLO-VU LARGE MISC
1.0000 | Freq: Once | Status: AC
  Administered 2020-05-19: 1

## 2020-05-19 NOTE — ED Triage Notes (Signed)
Pt has multiple complaints: abd pain and lower back pain for 2 weeks. Cold symptoms that started Sunday night; cough, congestion, sore throat, sob, chest tightness.  Pt a.o, resp e.u

## 2020-05-19 NOTE — ED Provider Notes (Signed)
[facility] EMERGENCY DEPARTMENT Provider Note   CSN: [id number] Arrival date & time: 05/19/20  0757     History Chief Complaint  Patient presents with   Back Pain   Cough   Shortness of Breath    Alyssa Brooks is a 34 y.o. female.  34 year old female with past medical history below including bipolar disorder, cerebral aneurysm who p/w multiple complaints including abdominal pain, diarrhea, and cough.  Patient tells me that for 1 week she has had constant, progressively worsening right lower abdominal pain.  She told triage that her pain has been going on for 2 weeks.  Pain gets worse just before she has a bowel movement and then is relieved for a [place] while but always comes back.  Pain also improves when she twists in a certain direction while laying in bed and when she stretches out.  She also notes that pain improves when she holds pressure in the area for a while.  When her abdominal pain began, she had vomiting and diarrhea symptoms.  Vomiting has resolved but diarrhea has been persistent.  She states that she has had multiple bowel movements per day, alternating between constipation and diarrhea.  She has not had these symptoms previously.  She states that approximately 2 days ago, she began having cold symptoms including cough, congestion, sore throat, and some shortness of breath.  Low-grade fevers and body aches.  No sick contacts.  She is not vaccinated against COVID-19.  The history is provided by the patient.  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   Cerebral aneurysm without rupture 10/19/2019   Carotid artery stenosis 10/08/2019   Cerebral aneurysm, nonruptured 10/08/2019    Past Surgical  History:  Procedure Laterality Date   BREAST CYST EXCISION     IR ANGIO INTRA EXTRACRAN SEL INTERNAL CAROTID BILAT MOD SED  08/02/2019   IR ANGIO INTRA EXTRACRAN SEL INTERNAL CAROTID BILAT MOD SED  10/08/2019   IR ANGIO VERTEBRAL SEL VERTEBRAL UNI R MOD SED  08/02/2019   IR PATIENT EVAL TECH 0-60 MINS  10/09/2019   IR TRANSCATH/EMBOLIZ  10/08/2019   RADIOLOGY WITH ANESTHESIA Right 10/08/2019   Procedure: Surpass Evolve embolization of right carotid aneurysm;  Surgeon: Lisbeth Renshaw, MD;  Location: [facility] OR;  Service: Radiology;  Laterality: Right;   TUBAL LIGATION       OB History    Gravida  7   Para  3   Term  3   Preterm      AB  4   Living  3     SAB  2   TAB  2   Ectopic      Multiple      Live Births              Family History  Problem Relation Age of Onset   Cancer Mother        cervical   Cancer Maternal Grandmother        uterine   Hypertension Maternal Grandfather    Diabetes Maternal Grandfather     Social History   Tobacco Use   Smoking status: Current Every Day Smoker    Packs/day: 0.50  Types: Cigarettes   Smokeless tobacco: Never Used  Substance Use Topics   Alcohol use: Yes    Comment: rare   Drug use: Yes    Frequency: 7.0 times per week    Types: Marijuana    Home Medications Prior to Admission medications   Medication Sig Start Date End Date Taking? Authorizing Provider  acetaminophen (TYLENOL) 500 MG tablet Take 1,000 mg by mouth every 8 (eight) hours as needed.    [provider]  aspirin 325 MG tablet Take 1 tablet (325 mg total) by mouth daily. 10/13/19   Bedelia Person, MD  methylPREDNISolone (MEDROL) 4 MG TBPK tablet Take according to package insert 10/21/19   Costella, Darci Current, PA-C  oxyCODONE-acetaminophen (PERCOCET) 7.5-325 MG tablet Take 1 tablet by mouth every 4 (four) hours as needed for severe pain. 10/21/19   Costella, Darci Current, PA-C  predniSONE (DELTASONE) 20 MG tablet Take 2 tablets  (40 mg total) by mouth daily. 05/19/20   Beyza Bellino, Ambrose Finland, MD    Allergies    Adhesive [tape], Keflex [cephalexin monohydrate], and Penicillins  Review of Systems   Review of Systems  Respiratory: Positive for cough and shortness of breath.   Musculoskeletal: Positive for back pain.   All other systems reviewed and are negative except that which was mentioned in HPI  Physical Exam Updated Vital Signs BP 135/84    Pulse 91    Temp 99.2 F (37.3 C) (Oral)    Resp (!) 24    Ht 5\' 4"  (1.626 m)    Wt 73.5 kg    LMP 05/05/2020    SpO2 97%    BMI 27.81 kg/m   Physical Exam Vitals and nursing note reviewed.  Constitutional:      General: She is not in acute distress.    Appearance: She is well-developed.  HENT:     Head: Normocephalic and atraumatic.  Eyes:     Conjunctiva/sclera: Conjunctivae normal.  Cardiovascular:     Rate and Rhythm: Normal rate and regular rhythm.     Heart sounds: Normal heart sounds. No murmur heard.   Pulmonary:     Effort: Pulmonary effort is normal.     Comments: Coarse breath sounds b/l with occasional expiratory wheezes Abdominal:     General: Bowel sounds are normal. There is no distension.     Palpations: Abdomen is soft.     Tenderness: There is abdominal tenderness (RLQ). There is no guarding or rebound.  Musculoskeletal:     Cervical back: Neck supple.  Skin:    General: Skin is warm and dry.  Neurological:     Mental Status: She is alert and oriented to person, place, and time.     Comments: Fluent speech  Psychiatric:        Mood and Affect: Mood normal.        Judgment: Judgment normal.     ED Results / Procedures / Treatments   Labs (all labs ordered are listed, but only abnormal results are displayed) Labs Reviewed  COMPREHENSIVE METABOLIC PANEL - Abnormal; Notable for the following components:      Result Value   CO2 18 (*)    Total Bilirubin 0.2 (*)    All other components within normal limits  CBC - Abnormal; Notable  for the following components:   WBC 11.5 (*)    All other components within normal limits  RESPIRATORY PANEL BY RT PCR (FLU A&B, COVID)  LIPASE, BLOOD  URINALYSIS, ROUTINE W REFLEX  MICROSCOPIC  I-STAT BETA HCG BLOOD, ED ([facility], [facility], [facility] ONLY)  TROPONIN I (HIGH SENSITIVITY)    EKG EKG Interpretation  Date/Time:  Tuesday May 19 2020 07:57:47 EST Ventricular Rate:  110 PR Interval:  146 QRS Duration: 82 QT Interval:  322 QTC Calculation: 435 R Axis:   83 Text Interpretation: Sinus tachycardia Otherwise normal ECG rate faster than previous Confirmed by Frederick Peers [phone]) on 05/19/2020 9:13:47 AM   Radiology CT Abdomen Pelvis W Contrast  Result Date: 05/19/2020 CLINICAL DATA:  Right lower quadrant pain and back pain for 2 weeks. EXAM: CT ABDOMEN AND PELVIS WITH CONTRAST TECHNIQUE: Multidetector CT imaging of the abdomen and pelvis was performed using the standard protocol following bolus administration of intravenous contrast. CONTRAST:  OMNIPAQUE IOHEXOL 300 MG/ML  SOLN COMPARISON:  09/09/2013 FINDINGS: Lower Chest: No acute findings. Hepatobiliary: No hepatic masses identified. Gallbladder is unremarkable. No evidence of biliary ductal dilatation. Pancreas:  No mass or inflammatory changes. Spleen: Within normal limits in size and appearance. Adrenals/Urinary Tract: No masses identified. No evidence of ureteral calculi or hydronephrosis. Stomach/Bowel: Small to moderate hiatal hernia is seen. No evidence of obstruction, inflammatory process or abnormal fluid collections. Normal appendix visualized. Vascular/Lymphatic: No pathologically enlarged lymph nodes. No abdominal aortic aneurysm. Reproductive: No mass or other significant abnormality. Bilateral tubal ligation clips noted. Other:  None. Musculoskeletal:  No suspicious bone lesions identified. IMPRESSION: No evidence of appendicitis or other acute findings. Small to moderate hiatal hernia. Electronically Signed   By: Danae Orleans M.D.   On: 05/19/2020 12:17   DG Chest Portable 1 View  Result Date: 05/19/2020 CLINICAL DATA:  Cough and shortness of breath. EXAM: PORTABLE CHEST 1 VIEW COMPARISON:  06/01/2016 FINDINGS: Both lungs are clear. Negative for a pneumothorax. Heart and mediastinum are within normal limits. No large pleural effusions. Bone structures are unremarkable. IMPRESSION: No active disease. Electronically Signed   By: Richarda Overlie M.D.   On: 05/19/2020 08:33    Procedures Procedures (including critical care time)  Medications Ordered in ED Medications  albuterol (VENTOLIN HFA) 108 (90 Base) MCG/ACT inhaler 2 puff (2 puffs Inhalation Given 05/19/20 1013)  AeroChamber Plus Flo-Vu Large MISC 1 each (1 each Other Given 05/19/20 1013)  acetaminophen (TYLENOL) tablet 1,000 mg (1,000 mg Oral Given 05/19/20 1048)  iohexol (OMNIPAQUE) 300 MG/ML solution 100 mL (100 mLs Intravenous Contrast Given 05/19/20 1159)    ED Course  I have reviewed the triage vital signs and the nursing notes.  Pertinent labs & imaging results that were available during my care of the patient were reviewed by me and considered in my medical decision making (see chart for details).    MDM Rules/Calculators/A&P                          Pt non-toxic on exam, reassuring VS. Coarse breath sounds b/l, occasional wheezes. CXR clear and COVID-19 negative. Suspect acute bronchitis from viral URI, she has had bronchitis and used inhaler previously. Improved after albuterol, recommended continuing albuterol and offered prednisone burst.  Regarding abd pain and GI symptoms, labs show WBC 11.5, otherwise reassuring. I explained that timeline and description are not highly suggestive of appendicitis; discussed options of watching sx at home vs imaging. Pt wanted to proceed with CT. CT abd/pelvis normal. Pt comfortable on reassessment.  Discussed supportive measures for diarrhea including probiotics and slow advancement of diet.  Reviewed  return precautions and patient voiced understanding. Final Clinical Impression(s) /  ED Diagnoses Final diagnoses:  Pain, abdominal, RLQ  Diarrhea, unspecified type  Bronchitis    Rx / DC Orders ED Discharge Orders         Ordered    predniSONE (DELTASONE) 20 MG tablet  Daily        05/19/20 1242           Tanysha Quant, Ambrose Finlandachel Morgan, MD 05/19/20 1251

## 2020-05-19 NOTE — ED Notes (Signed)
Patient verbalizes understanding of discharge instructions. Opportunity for questioning and answers were provided. Armband removed by staff, pt discharged from ED and ambulated to lobby to return home.   

## 2020-09-15 ENCOUNTER — Emergency Department (HOSPITAL_BASED_OUTPATIENT_CLINIC_OR_DEPARTMENT_OTHER): Payer: 59

## 2020-09-15 ENCOUNTER — Other Ambulatory Visit: Payer: Self-pay

## 2020-09-15 ENCOUNTER — Encounter (HOSPITAL_BASED_OUTPATIENT_CLINIC_OR_DEPARTMENT_OTHER): Payer: Self-pay

## 2020-09-15 ENCOUNTER — Emergency Department (HOSPITAL_BASED_OUTPATIENT_CLINIC_OR_DEPARTMENT_OTHER)
Admission: EM | Admit: 2020-09-15 | Discharge: 2020-09-15 | Disposition: A | Discharge status: Home / Self Care | Payer: 59 | Attending: Emergency Medicine | Admitting: Emergency Medicine

## 2020-09-15 DIAGNOSIS — W01198A Fall on same level from slipping, tripping and stumbling with subsequent striking against other object, initial encounter: Secondary | ICD-10-CM | POA: Diagnosis not present

## 2020-09-15 DIAGNOSIS — Z7982 Long term (current) use of aspirin: Secondary | ICD-10-CM | POA: Diagnosis not present

## 2020-09-15 DIAGNOSIS — E232 Diabetes insipidus: Secondary | ICD-10-CM | POA: Insufficient documentation

## 2020-09-15 DIAGNOSIS — S62525A Nondisplaced fracture of distal phalanx of left thumb, initial encounter for closed fracture: Secondary | ICD-10-CM | POA: Insufficient documentation

## 2020-09-15 DIAGNOSIS — S6992XA Unspecified injury of left wrist, hand and finger(s), initial encounter: Secondary | ICD-10-CM | POA: Diagnosis present

## 2020-09-15 DIAGNOSIS — F17210 Nicotine dependence, cigarettes, uncomplicated: Secondary | ICD-10-CM | POA: Diagnosis not present

## 2020-09-15 NOTE — ED Triage Notes (Signed)
Pt states she fell yesterday-pain to left thumb-NAD-steady gait

## 2020-09-15 NOTE — Discharge Instructions (Addendum)
You have a fracture of the distal phalanx of your left thumb.  As we discussed this is very tip of your thumb. Please follow-up with your primary care doctor.  Please wear the splint. This will take time to heal you may use Tylenol and ibuprofen as discussed below.  You may rest ice and elevate your thumb. Please use Tylenol or ibuprofen for pain.  You may use 600 mg ibuprofen every 6 hours or 1000 mg of Tylenol every 6 hours.  You may choose to alternate between the 2.  This would be most effective.  Not to exceed 4 g of Tylenol within 24 hours.  Not to exceed 3200 mg ibuprofen 24 hours.

## 2020-09-15 NOTE — ED Provider Notes (Signed)
[facility] EMERGENCY DEPARTMENT Provider Note   CSN: [id number] Arrival date & time: 09/15/20  1717     History Chief Complaint  Patient presents with  . Finger Injury    Alyssa Brooks is a 35 y.o. female.  HPI Patient is a 35 year old female with past medical history significant for bipolar 1 and headache.  She is presented today after he fell yesterday due to mechanical fall and slammed her left thumb into the railing of her bed.  She states since that time she has had severe achiness with some swelling.  She denies any other injuries from the fall.  No head injuries or loss of consciousness no nausea or vomiting.       Past Medical History:  Diagnosis Date  . Bipolar 1 disorder (HCC)   . Headache     Patient Active Problem List   Diagnosis Date Noted  . 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 Cerebral aneurysm without rupture 10/19/2019  . Carotid artery stenosis 10/08/2019  . Cerebral aneurysm, nonruptured 10/08/2019    Past Surgical History:  Procedure Laterality Date  . BREAST CYST EXCISION    . IR ANGIO INTRA EXTRACRAN SEL INTERNAL CAROTID BILAT MOD SED  08/02/2019  . IR ANGIO INTRA EXTRACRAN SEL INTERNAL CAROTID BILAT MOD SED  10/08/2019  . IR ANGIO VERTEBRAL SEL VERTEBRAL UNI R MOD SED  08/02/2019  . IR PATIENT EVAL TECH 0-60 MINS  10/09/2019  . IR TRANSCATH/EMBOLIZ  10/08/2019  . RADIOLOGY WITH ANESTHESIA Right 10/08/2019   Procedure: Surpass Evolve embolization of right carotid aneurysm;  Surgeon: Lisbeth Renshaw, MD;  Location: [facility] OR;  Service: Radiology;  Laterality: Right;  . TUBAL LIGATION       OB History    Gravida  7   Para  3   Term  3   Preterm      AB  4   Living  3     SAB  2   IAB  2   Ectopic      Multiple      Live Births              Family History  Problem Relation Age of Onset  . Cancer Mother        cervical  . Cancer Maternal Grandmother        uterine  . Hypertension Maternal Grandfather   .  Diabetes Maternal Grandfather     Social History   Tobacco Use  . Smoking status: Current Every Day Smoker    Packs/day: 0.50    Types: Cigarettes  . Smokeless tobacco: Never Used  Substance Use Topics  . Alcohol use: Yes    Comment: occ  . Drug use: Yes    Frequency: 7.0 times per week    Types: Marijuana    Home Medications Prior to Admission medications   Medication Sig Start Date End Date Taking? Authorizing Provider  acetaminophen (TYLENOL) 500 MG tablet Take 1,000 mg by mouth every 8 (eight) hours as needed.    [provider]  aspirin 325 MG tablet Take 1 tablet (325 mg total) by mouth daily. 10/13/19   Bedelia Person, MD  methylPREDNISolone (MEDROL) 4 MG TBPK tablet Take according to package insert 10/21/19   Costella, Darci Current, PA-C  oxyCODONE-acetaminophen (PERCOCET) 7.5-325 MG tablet Take 1 tablet by mouth every 4 (four) hours as needed for severe pain. 10/21/19   Costella, Darci Current, PA-C  predniSONE (DELTASONE) 20 MG tablet Take 2 tablets (  40 mg total) by mouth daily. 05/19/20   Little, Ambrose Finland, MD    Allergies    Adhesive [tape], Keflex [cephalexin monohydrate], and Penicillins  Review of Systems   Review of Systems  Constitutional: Negative for fever.  HENT: Negative for congestion.   Respiratory: Negative for shortness of breath.   Cardiovascular: Negative for chest pain.  Gastrointestinal: Negative for abdominal distention.  Musculoskeletal:       Left thumb pain  Neurological: Negative for dizziness and headaches.    Physical Exam Updated Vital Signs BP (!) 137/96 (BP Location: Left Arm)   Pulse 86   Temp 98.5 F (36.9 C) (Oral)   Resp 18   LMP 09/08/2020   SpO2 100%   Physical Exam Vitals and nursing note reviewed.  Constitutional:      General: She is not in acute distress.    Appearance: Normal appearance. She is not ill-appearing.  HENT:     Head: Normocephalic and atraumatic.  Eyes:     General: No scleral icterus.        Right eye: No discharge.        Left eye: No discharge.     Conjunctiva/sclera: Conjunctivae normal.  Pulmonary:     Effort: Pulmonary effort is normal.     Breath sounds: No stridor.  Musculoskeletal:     Comments: Mild tenderness to palpation of the left first digit distal phalanx.  No laceration abrasion or deformity.  No bruising.  Neurological:     Mental Status: She is alert and oriented to person, place, and time. Mental status is at baseline.     ED Results / Procedures / Treatments   Labs (all labs ordered are listed, but only abnormal results are displayed) Labs Reviewed - No data to display  EKG None  Radiology DG Finger Thumb Left  Result Date: 09/15/2020 CLINICAL DATA:  Injury, fall yesterday.  Thumb pain. EXAM: LEFT THUMB 2+V COMPARISON:  None. FINDINGS: Transverse nondisplaced fracture of the thumb distal phalanx. There is no intra-articular extension. Normal alignment and joint spaces. IMPRESSION: Nondisplaced thumb distal phalanx fracture. No intra-articular extension. Electronically Signed   By: Narda Rutherford M.D.   On: 09/15/2020 18:55    Procedures Procedures   Medications Ordered in ED Medications - No data to display  ED Course  I have reviewed the triage vital signs and the nursing notes.  Pertinent labs & imaging results that were available during my care of the patient were reviewed by me and considered in my medical decision making (see chart for details).    MDM Rules/Calculators/A&P                          X-ray reviewed by myself agree radiologist that there is a small nondisplaced distal phalanx fracture does not extend into the joint space.  Patient placed in splint.  She is distally neurovascularly intact.  Discharged with follow-up with PCP for  Final Clinical Impression(s) / ED Diagnoses Final diagnoses:  Nondisplaced fracture of distal phalanx of left thumb, initial encounter for closed fracture    Rx / DC Orders ED  Discharge Orders    None       Gailen Shelter, [place] 09/15/20 1934    Virgina Norfolk, DO 09/15/20 2311

## 2020-12-04 ENCOUNTER — Other Ambulatory Visit: Payer: Self-pay | Admitting: Neurosurgery

## 2020-12-04 DIAGNOSIS — I671 Cerebral aneurysm, nonruptured: Secondary | ICD-10-CM

## 2020-12-15 ENCOUNTER — Encounter (HOSPITAL_COMMUNITY): Payer: Self-pay

## 2020-12-15 ENCOUNTER — Ambulatory Visit (HOSPITAL_COMMUNITY): Admission: RE | Admit: 2020-12-15 | Payer: 59 | Source: Ambulatory Visit

## 2022-03-18 ENCOUNTER — Encounter: Payer: Self-pay | Admitting: Neurology

## 2022-04-04 ENCOUNTER — Ambulatory Visit (INDEPENDENT_AMBULATORY_CARE_PROVIDER_SITE_OTHER): Payer: Commercial Managed Care - HMO | Admitting: Neurology

## 2022-04-04 ENCOUNTER — Encounter: Payer: Self-pay | Admitting: Neurology

## 2022-04-04 VITALS — BP 133/84 | HR 87 | Ht 64.0 in | Wt 191.2 lb

## 2022-04-04 DIAGNOSIS — R569 Unspecified convulsions: Secondary | ICD-10-CM

## 2022-04-04 MED ORDER — OXCARBAZEPINE 300 MG PO TABS: ORAL_TABLET | ORAL | 6 refills | Status: DC

## 2022-04-04 NOTE — Progress Notes (Signed)
NEUROLOGY CONSULTATION NOTE  Alyssa Brooks MRN: [id number] DOB: October 10, 1985  Referring provider: Dr. Consuella Lose Primary care provider: none listed  Reason for consult:  new onset seizure  Dear Dr Kathyrn Sheriff:  Thank you for your kind referral of Alyssa Brooks for consultation of the above symptoms. Although her history is well known to you, please allow me to reiterate it for the purpose of our medical record. She is alone in the office today. Records and images were personally reviewed where available.   HISTORY OF PRESENT ILLNESS: This is a very pleasant 36 year old left-handed woman with a history of right ICA aneurysm s/p stent in 10/2019 presenting for evaluation of new onset seizure. 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 Hospitalization was complicated by in-stent thrombosis and collapse of stent. 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 She was discharged improved and did well for 2 years with no significant issues. She would have occasional headaches pain pressure in the right occipital, left periorbital/temporal region that was alleviated by applying pressure. Around July 2023, she started having recurrent episodes of left arm numbness starting in the fingers radiating up her arm/neck then to the left side of her face. They would last 30-60 seconds with no other symptoms. Her son passed away from cancer and on his wake on 8/14, she had multiple episodes (15 times in one day) that time. In August, she  went on a trip to the beach with her 63 and 56 year old children. During their trip, she was not feeling great, having headaches, but still able to do a lot of activities. On the drive home on 1/69/67, she was not feeling good and the headache was getting more intense with associated nausea. She stopped at a hotel to try to check in but could not, so she tried to drive back home but then started vomiting. She does not remember much at this point, she remembers finding a hospital and going into their ER. She recalls having a CT scan and getting abnormally hot, then has no memory of events until she was in her mother's home in [place]. Her daughter reported she went to the bathroom and was very cold/shivering, came back out and sat on the bed then started hollering she wanted her mother and wanted to leave. She was staring/spacing out, then had a convulsion. She bit her tongue bad. She was given IV Ativan and Keppra but was not discharged on seizure medication. She had a CT head reporting left frontal lobe encephalomalacia, intracranial right ICA stent, occluded right ICA artery from origin to to the ICA terminus. Since hospital discharge, she denies any further GTCs. Interestingly, the episodes of left-sided numbness also stopped, except for one time 5 days ago affecting only her arm and neck. She lives with her 2 children who have not mentioned any other staring/unresponsive episodes. She denies any olfactory/gustatory hallucinations, myoclonic jerks. She still gets headache, more when she cries a lot. 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 Back is not getting much better. She rarely drinks alcohol, none during the time of the seizure. She usually gets 6-7 hours of sleep and although sleep was broken up that time, she would not say she was sleep deprived. Mood is okay, she is grieving her son but denies significant  depression.  Epilepsy Risk Factors:  Left frontal encephalomalacia. Otherwise she had a normal birth and early development.  There is no history of febrile convulsions, CNS infections such as meningitis/encephalitis, significant traumatic brain injury, neurosurgical procedures, or family history of seizures.  I personally reviewed MRI brain without contrast done 03/29/22 which did not show any acute changes. Report indicates focal encephalomalacia of the anterior right frontal lobe. On my read, there is also a larger area of encephalomalacia in the left frontal lobe.    PAST MEDICAL HISTORY: Past Medical History:  Diagnosis Date   Bipolar 1 disorder (HCC)    Headache     PAST SURGICAL HISTORY: Past Surgical History:  Procedure Laterality Date   BREAST CYST EXCISION     IR ANGIO INTRA EXTRACRAN SEL INTERNAL CAROTID BILAT MOD SED  08/02/2019   IR ANGIO INTRA EXTRACRAN SEL INTERNAL CAROTID BILAT MOD SED  10/08/2019   IR ANGIO VERTEBRAL SEL VERTEBRAL UNI R MOD SED  08/02/2019   IR PATIENT EVAL TECH 0-60 MINS  10/09/2019   IR TRANSCATH/EMBOLIZ  10/08/2019   RADIOLOGY WITH ANESTHESIA Right 10/08/2019   Procedure: Surpass Evolve embolization of right carotid aneurysm;  Surgeon: Lisbeth Renshaw, MD;  Location: [facility] OR;  Service: Radiology;  Laterality: Right;   TUBAL LIGATION      MEDICATIONS: No current outpatient medications on file prior to visit.   No current facility-administered medications on file prior to visit.    ALLERGIES: Allergies  Allergen Reactions   Hydromorphone     Other reaction(s): confusion, vomiting   Adhesive [Tape]     Paper tape pulls off skin, regular adhesive tape is fine   Keflex [Cephalexin Monohydrate] Diarrhea and Nausea And Vomiting   Penicillins Nausea And Vomiting    Did it involve swelling of the face/tongue/throat, SOB, or low BP? No Did it involve sudden or severe rash/hives, skin peeling, or any reaction on the inside of your mouth or nose? No Did you  need to seek medical attention at a hospital or doctor's office? No When did it last happen?      3 + years If all above answers are "NO", may proceed with cephalosporin use.     FAMILY HISTORY: Family History  Problem Relation Age of Onset   Cancer Mother        cervical   Cancer Maternal Grandmother        uterine   Hypertension Maternal Grandfather    Diabetes Maternal Grandfather     SOCIAL HISTORY: Social History   Socioeconomic History   Marital status: Single    Spouse name: Not on file   Number of children: Not on file   Years of education: Not on file   Highest education level: Not on file  Occupational History   Not on file  Tobacco Use   Smoking status: Every Day    Packs/day: 0.50    Types: Cigarettes   Smokeless tobacco: Never  Vaping Use   Vaping Use: Never used  Substance and Sexual Activity   Alcohol use: Yes    Comment: occ   Drug use: Yes    Frequency: 7.0 times per week  Types: Marijuana   Sexual activity: Not on file  Other Topics Concern   Not on file  Social History Narrative   Left handed    Social Determinants of Health   Financial Resource Strain: Not on file  Food Insecurity: Not on file  Transportation Needs: No Transportation Needs (10/18/2019)   PRAPARE - Administrator, Civil Service (Medical): No    Lack of Transportation (Non-Medical): No  Physical Activity: Not on file  Stress: Not on file  Social Connections: Not on file  Intimate Partner Violence: Not on file     PHYSICAL EXAM: Vitals:   04/04/22 1244  BP: 133/84  Pulse: 87  SpO2: 100%   General: No acute distress Head:  Normocephalic/atraumatic Skin/Extremities: No rash, no edema Neurological Exam: Mental status: alert and oriented to person, place, and time, no dysarthria or aphasia, Fund of knowledge is appropriate.  Recent and remote memory are intact, 3/3 delayed recall.  Attention and concentration are normal, 5/5 WORLD backwards.  Cranial  nerves: CN I: not tested CN II: pupils equal, round and reactive to light, visual fields intact CN III, IV, VI:  full range of motion, no nystagmus, no ptosis CN V: facial sensation intact CN VII: upper and lower face symmetric CN VIII: hearing intact to conversation Bulk & Tone: normal, no fasciculations. Motor: 5/5 throughout with no pronator drift. Sensation: intact to light touch, cold, pin, vibration sense.  No extinction to double simultaneous stimulation.  Romberg test negative Deep Tendon Reflexes: +1 throughout Cerebellar: no incoordination on finger to nose testing Gait: narrow-based and steady, able to tandem walk adequately. Tremor: none   IMPRESSION: This is a very pleasant 36 year old left-handed woman with a history of right ICA aneurysm s/p stent in 10/2019 presenting for evaluation of new onset convulsive seizure on 02/22/2022. She had been feeling unwell prior then in the ER became confused, spacing out, followed by a convulsion. For the past month or so, she has also been having recurrent transient episodes of left face/arm numbness concerning for focal sensory seizures. MRI brain showed bilateral frontal encephalomalacia L>R. We discussed doing an EEG. At this point, with recurrent symptoms and underlying structural abnormalities, we discussed recommendation to start seizure medication, side effects on oxcarbazepine were discussed, start 300mg  BID x 1 week, then increase to 600mg  BID. [place] driving laws were discussed with the patient, and she knows to stop driving after a seizure, until 6 months seizure-free. Follow-up in 3 months, call for any changes.    Thank you for allowing me to participate in the care of this patient. Please do not hesitate to call for any questions or concerns.   , M.D.  CC: Dr. 

## 2022-04-04 NOTE — Patient Instructions (Addendum)
Good to meet you.  Schedule EEG  2. Start oxcarbazepine (Trileptal) 300mg : take 1 tablet twice a day for 1 week, then increase to 2 tablets twice a day and continue  3. Follow-up in 3 months, call for any changes   Seizure Precautions: 1. If medication has been prescribed for you to prevent seizures, take it exactly as directed.  Do not stop taking the medicine without talking to your doctor first, even if you have not had a seizure in a long time.   2. Avoid activities in which a seizure would cause danger to yourself or to others.  Don't operate dangerous machinery, swim alone, or climb in high or dangerous places, such as on ladders, roofs, or girders.  Do not drive unless your doctor says you may.  3. If you have any warning that you may have a seizure, lay down in a safe place where you can't hurt yourself.    4.  No driving for 6 months from last seizure, as per [facility].   Please refer to the following link on the [facility] website for more information: http://[email or web address]/[email or web address]/driving/drivingu.cfm   5.  Maintain good sleep hygiene. Avoid alcohol.  6.  Notify your neurology if you are planning pregnancy or if you become pregnant.  7.  Contact your doctor if you have any problems that may be related to the medicine you are taking.  8.  Call 911 and bring the patient back to the ED if:        A.  The seizure lasts longer than 5 minutes.       B.  The patient doesn't awaken shortly after the seizure  C.  The patient has new problems such as difficulty seeing, speaking or moving  D.  The patient was injured during the seizure  E.  The patient has a temperature over 102 F (39C)  F.  The patient vomited and now is having trouble breathing

## 2022-04-27 ENCOUNTER — Ambulatory Visit (HOSPITAL_COMMUNITY)
Admission: RE | Admit: 2022-04-27 | Discharge: 2022-04-27 | Disposition: A | Discharge status: Home / Self Care | Payer: Commercial Managed Care - HMO | Source: Ambulatory Visit | Attending: Neurology | Admitting: Neurology

## 2022-04-27 DIAGNOSIS — R569 Unspecified convulsions: Secondary | ICD-10-CM | POA: Diagnosis present

## 2022-04-27 NOTE — Progress Notes (Signed)
EEG complete - results pending 

## 2022-04-29 NOTE — Procedures (Signed)
ELECTROENCEPHALOGRAM REPORT  Date of Study: 04/27/2022  Patient's Name: Alyssa Brooks MRN: [id number] Date of Birth: 06/20/1986  Referring Provider: Dr. Ellouise Newer  Clinical History: This is a 36 year old woman with history of right ICA aneurysm s/p stent with new onset seizure. EEG for classification.  Medications: oxcarbazepine  Technical Summary: A multichannel digital EEG recording measured by the international 10-20 system with electrodes applied with paste and impedances below 5000 ohms performed in our laboratory with EKG monitoring in an awake and drowsy patient.  Hyperventilation and photic stimulation were performed.  The digital EEG was referentially recorded, reformatted, and digitally filtered in a variety of bipolar and referential montages for optimal display.    Description: The patient is awake and drowsy during the recording.  During maximal wakefulness, there is a symmetric, medium voltage 10 Hz posterior dominant rhythm that attenuates with eye opening.  The record is symmetric.  During drowsiness, there is an increase in theta slowing of the background.  Sleep was not captured. Hyperventilation and photic stimulation did not elicit any abnormalities.  There were no epileptiform discharges or electrographic seizures seen.    EKG lead was unremarkable.  Impression: This awake and drowsy EEG is normal.    Clinical Correlation: A normal EEG does not exclude a clinical diagnosis of epilepsy.  If further clinical questions remain, prolonged EEG may be helpful.  Clinical correlation is advised.   Ellouise Newer, M.D.

## 2022-07-14 ENCOUNTER — Encounter: Payer: Self-pay | Admitting: Neurology

## 2022-07-14 ENCOUNTER — Ambulatory Visit: Payer: Commercial Managed Care - HMO | Admitting: Neurology

## 2022-07-14 VITALS — BP 114/79 | HR 106 | Ht 64.0 in | Wt 195.2 lb

## 2022-07-14 DIAGNOSIS — R200 Anesthesia of skin: Secondary | ICD-10-CM | POA: Diagnosis not present

## 2022-07-14 DIAGNOSIS — R569 Unspecified convulsions: Secondary | ICD-10-CM

## 2022-07-14 MED ORDER — OXCARBAZEPINE 300 MG PO TABS: ORAL_TABLET | ORAL | 6 refills | Status: AC

## 2022-07-14 NOTE — Patient Instructions (Signed)
Good to see you.  Restart oxcarbazepine 300mg : take 1 tablet twice a day for 1 week, then increase to 2 tablets twice a day and continue  2. Schedule EMG/NCV of the left upper extremity  3. Keep a calendar of your symptoms and see if they cut down with restarting medication  4. Follow-up in 3-4 months, call for any changes   Seizure Precautions: 1. If medication has been prescribed for you to prevent seizures, take it exactly as directed.  Do not stop taking the medicine without talking to your doctor first, even if you have not had a seizure in a long time.   2. Avoid activities in which a seizure would cause danger to yourself or to others.  Don't operate dangerous machinery, swim alone, or climb in high or dangerous places, such as on ladders, roofs, or girders.  Do not drive unless your doctor says you may.  3. If you have any warning that you may have a seizure, lay down in a safe place where you can't hurt yourself.    4.  No driving for 6 months from last seizure, as per [facility].   Please refer to the following link on the [facility] website for more information: http://[email or web address]/answerplace/Social/driving/drivingu.cfm   5.  Maintain good sleep hygiene. Avoid alcohol.  6.  Notify your neurology if you are planning pregnancy or if you become pregnant.  7.  Contact your doctor if you have any problems that may be related to the medicine you are taking.  8.  Call 911 and bring the patient back to the ED if:        A.  The seizure lasts longer than 5 minutes.       B.  The patient doesn't awaken shortly after the seizure  C.  The patient has new problems such as difficulty seeing, speaking or moving  D.  The patient was injured during the seizure  E.  The patient has a temperature over 102 F (39C)  F.  The patient vomited and now is having trouble breathing

## 2022-07-14 NOTE — Progress Notes (Signed)
NEUROLOGY FOLLOW UP OFFICE NOTE  Yazmeen Woolf [id number] May 20, 1986  HISTORY OF PRESENT ILLNESS: I had the pleasure of seeing Karmen Hartsock in follow-up in the neurology clinic on 07/14/2022.  The patient was last seen 3 months ago after she had a seizure on 02/22/22. She was also having recurrent episodes of left arm/face numbness. EEG in 04/2022 was normal. Since her last visit, she reports that they had moved homes and she misplaced her prescription for oxcarbazepine. She thinks she was taking it once a day and does not recall any side effects. She is on Gabapentin 300mg  qhs prn for nerve pain, her back hurts a lot. SHe denies any further seizures since 02/2022. The numbness on the face has not recurred, however she continues to have left arm numbness and aching, feeling weird 3 or 4 days a week, lasting up to the next day. Her wrist aches and she has to squeeze it. She has some neck pain. She denies any staring/unresponsive episodes, gaps in time, olfactory/gustatory hallucinations, myoclonic jerks. Sleep varies, sometimes she sleeps 10-11 hours, other times she only get 2-3 hours of sleep. She fell going up stairs a few weeks ago, no loss of consciousness.    History on Initial Assessment 04/04/2022: This is a very pleasant 37 year old left-handed woman with a history of right ICA aneurysm s/p stent in 10/2019 presenting for evaluation of new onset seizure. 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 Hospitalization was complicated by in-stent thrombosis and collapse of stent. 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 She was discharged improved and did well for 2 years with no significant issues. She would have occasional headaches pain pressure in the right occipital, left periorbital/temporal region that was alleviated by applying pressure. Around July 2023, she started having recurrent episodes of left arm numbness starting in the fingers radiating up her arm/neck then to the left side of her face. They would last 30-60 seconds with no other symptoms. Her son passed away from cancer and on his wake on 8/14, she had multiple episodes (15 times in one day) that time. In August, she went on a trip to the beach with her 40 and 15 year old children. During their trip, she was not feeling great, having headaches, but still able to do a lot of activities. On the drive home on 18, she was not feeling good and the headache was getting more intense with associated nausea. She stopped at a hotel to try to check in but could not, so she tried to drive back home but then started vomiting. She does not remember much at this point, she remembers finding a hospital and going into their ER. She recalls having a CT scan and getting abnormally hot, then has no memory of events until she was in her mother's home in 12/22/28. Her daughter reported she went to the bathroom and was very cold/shivering, came back out and sat on the bed then started hollering she wanted her mother and wanted to leave. She was staring/spacing out, then had a convulsion. She bit her  tongue bad. She was given IV Ativan and Keppra but was not discharged on seizure medication. She had a CT head reporting left frontal lobe encephalomalacia, intracranial right ICA stent, occluded right ICA artery from origin to to the ICA terminus. Since hospital discharge, she denies any further GTCs. Interestingly, the episodes of left-sided numbness also stopped, except for one time 5 days ago  affecting only her arm and neck. She lives with her 2 children who have not mentioned any other staring/unresponsive episodes. She denies any olfactory/gustatory hallucinations, myoclonic jerks. She still gets headache, more when she cries a lot. 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 Back is not getting much better. She rarely drinks alcohol, none during the time of the seizure. She usually gets 6-7 hours of sleep and although sleep was broken up that time, she would not say she was sleep deprived. Mood is okay, she is grieving her son but denies significant depression.  Epilepsy Risk Factors:  Left frontal encephalomalacia. Otherwise she had a normal birth and early development.  There is no history of febrile convulsions, CNS infections such as meningitis/encephalitis, significant traumatic brain injury, neurosurgical procedures, or family history of seizures.  I personally reviewed MRI brain without contrast done 03/29/22 which did not show any acute changes. Report indicates focal encephalomalacia of the anterior right frontal lobe. On my read, there is also a larger area of encephalomalacia in the left frontal lobe.    PAST MEDICAL HISTORY: Past Medical History:  Diagnosis Date   Bipolar 1 disorder ([place])    Headache     MEDICATIONS: Current Outpatient Medications on File Prior to Visit  Medication Sig Dispense Refill   gabapentin (NEURONTIN) 300 MG capsule Take 300 mg by mouth 3 (three) times daily. Takes it at night     Oxcarbazepine (TRILEPTAL) 300 MG tablet Take 1 tablet twice a day for 1 week, then increase to 2 tablets twice a day (Patient not taking: Reported on 07/14/2022) 120 tablet 6   No current facility-administered medications on file prior to visit.    ALLERGIES: Allergies  Allergen Reactions   Hydromorphone     Other reaction(s): confusion, vomiting   Adhesive [Tape]     Paper  tape pulls off skin, regular adhesive tape is fine   Keflex [Cephalexin Monohydrate] Diarrhea and Nausea And Vomiting   Penicillins Nausea And Vomiting    Did it involve swelling of the face/tongue/throat, SOB, or low BP? No Did it involve sudden or severe rash/hives, skin peeling, or any reaction on the inside of your mouth or nose? No Did you need to seek medical attention at a hospital or doctor's office? No When did it last happen?      3 + years If all above answers are "NO", may proceed with cephalosporin use.     FAMILY HISTORY: Family History  Problem Relation Age of Onset   Cancer Mother        cervical   Cancer Maternal Grandmother        uterine   Hypertension Maternal Grandfather    Diabetes Maternal Grandfather     SOCIAL HISTORY: Social History   Socioeconomic History   Marital status: Single    Spouse name: Not on file   Number of children: Not on file   Years of education: Not on file   Highest education level: Not on file  Occupational History  Not on file  Tobacco Use   Smoking status: Every Day    Packs/day: 0.50    Types: Cigarettes   Smokeless tobacco: Never  Vaping Use   Vaping Use: Never used  Substance and Sexual Activity   Alcohol use: Yes    Comment: occ   Drug use: Yes    Frequency: 7.0 times per week    Types: Marijuana   Sexual activity: Not on file  Other Topics Concern   Not on file  Social History Narrative   Left handed    Son passed way in 2023   Social Determinants of Health   Financial Resource Strain: Not on file  Food Insecurity: Not on file  Transportation Needs: No Transportation Needs (10/18/2019)   PRAPARE - Hydrologist (Medical): No    Lack of Transportation (Non-Medical): No  Physical Activity: Not on file  Stress: Not on file  Social Connections: Not on file  Intimate Partner Violence: Not on file     PHYSICAL EXAM: Vitals:   07/14/22 1115  BP: 114/79  Pulse: (!) 106   SpO2: 100%   General: No acute distress Head:  Normocephalic/atraumatic Skin/Extremities: No rash, no edema Neurological Exam: alert and awake. No aphasia or dysarthria. Fund of knowledge is appropriate. Attention and concentration are normal.   Cranial nerves: Pupils equal, round. Extraocular movements intact with no nystagmus. Visual fields full.  No facial asymmetry.  Motor: Bulk and tone normal, muscle strength 5/5 throughout except for 4/5 left APB, no pronator drift.Sensation "feels different" to pin on the left. Finger to nose testing intact.  Gait narrow-based and steady, able to tandem walk adequately.  Romberg negative. Negative Tinel sign at the wrist.    IMPRESSION: This is a very pleasant 37 yo LH woman with a history of right ICA aneurysm s/p stent in 10/2019 with new onset convulsive seizure on 02/22/2022. She had been feeling unwell prior then in the ER became confused, spacing out, followed by a convulsion. She had also been having recurrent transient episodes of left face/arm numbness. MRI brain showed bilateral frontal encephalomalacia L>R, EEG normal. No convulsions since 02/2022, she continues to report left arm numbness. We discussed restarting oxcarbazepine 300mg  BID x 1 week, then increase to 600mg  BID. Monitor left-sided symptoms. She will be scheduled for an EMG/NCV of the left upper extremity to rule out peripheral etiology. She is aware of [place] driving laws to stop driving after a seizure until 6 months seizure-free. Follow-up in 3-4 months, call for any changes.    Thank you for allowing me to participate in her care.  Please do not hesitate to call for any questions or concerns.   Ellouise Newer, M.D.

## 2022-07-22 ENCOUNTER — Ambulatory Visit: Payer: Commercial Managed Care - HMO | Admitting: Neurology

## 2022-07-22 DIAGNOSIS — R569 Unspecified convulsions: Secondary | ICD-10-CM | POA: Diagnosis not present

## 2022-07-22 DIAGNOSIS — R200 Anesthesia of skin: Secondary | ICD-10-CM

## 2022-07-22 NOTE — Procedures (Signed)
  [facility]  [address]  [place], [place] [address] Tel: [phone] Fax: [phone] Test Date:  07/22/2022  Patient: Alyssa Brooks DOB: Feb 26, 1986 Physician: Narda Amber, DO  Sex: Female Height: 5\' 4"  Ref Phys: Ellouise Newer, MD  ID#: [id number]   Technician:    History: This is a 37 year old female referred for evaluation of left foot paresthesias and pain.  NCV & EMG Findings: Extensive electrodiagnostic testing of the right upper extremity shows: Left median, ulnar, and mixed palmar sensory responses are within normal limits. Left median and ulnar motor responses are within normal limits. There is no evidence of active or chronic motor axonal loss changes affecting any of the tested muscles.  Motor unit configuration and recruitment pattern is within normal limits.  Impression: This is a normal study of the left upper extremity.  In particular, there is no evidence of carpal tunnel syndrome, ulnar neuropathy, or cervical radiculopathy.   ___________________________ Narda Amber, DO    Nerve Conduction Studies   Stim Site NR Peak (ms) Norm Peak (ms) O-P Amp (V) Norm O-P Amp  Left Median Anti Sensory (2nd Digit)  32 C  Wrist    3.4 <3.4 49.9 >20  Left Ulnar Anti Sensory (5th Digit)  32 C  Wrist    3.1 <3.1 33.6 >12     Stim Site NR Onset (ms) Norm Onset (ms) O-P Amp (mV) Norm O-P Amp Site1 Site2 Delta-0 (ms) Dist (cm) Vel (m/s) Norm Vel (m/s)  Left Median Motor (Abd Poll Brev)  32 C  Wrist    3.1 <3.9 13.1 >6 Elbow Wrist 4.9 27.0 55 >50  Elbow    8.0  12.2         Left Ulnar Motor (Abd Dig Minimi)  32 C  Wrist    2.6 <3.1 9.7 >7 B Elbow Wrist 3.5 20.0 57 >50  B Elbow    6.1  9.7  A Elbow B Elbow 1.9 10.0 53 >50  A Elbow    8.0  9.4            Stim Site NR Peak (ms) Norm Peak (ms) P-T Amp (V) Site1 Site2 Delta-P (ms) Norm Delta (ms)  Left Median/Ulnar Palm Comparison (Wrist - 8cm)  32 C  Median Palm    1.8 <2.2 66.4 Median Palm  Ulnar Palm 0.1   Ulnar Palm    1.9 <2.2 12.1       Electromyography   Side Muscle Ins.Act Fibs Fasc Recrt Amp Dur Poly Activation Comment  Left 1stDorInt Nml Nml Nml Nml Nml Nml Nml Nml N/A  Left PronatorTeres Nml Nml Nml Nml Nml Nml Nml Nml N/A  Left Biceps Nml Nml Nml Nml Nml Nml Nml Nml N/A  Left Triceps Nml Nml Nml Nml Nml Nml Nml Nml N/A  Left Deltoid Nml Nml Nml Nml Nml Nml Nml Nml N/A      Waveforms:

## 2022-07-26 ENCOUNTER — Telehealth: Payer: Self-pay | Admitting: Neurology

## 2022-07-26 ENCOUNTER — Telehealth: Payer: Self-pay

## 2022-07-26 NOTE — Telephone Encounter (Signed)
-----  Message from Cameron Sprang, MD sent at 07/24/2022  6:00 PM EST ----- Pls let her know the nerve and muscle test was normal, no indication of a pinched nerve in the left arm. Pls have her keep a calendar of the left arm numbness as she starts the oxcarbazepine and it gets into her system more. Thanks

## 2022-07-26 NOTE — Telephone Encounter (Signed)
Pt called an informed that nerve and muscle test was normal, no indication of a pinched nerve in the left arm. Pls have her keep a calendar of the left arm numbness as she starts the oxcarbazepine and it gets into her system more

## 2022-07-26 NOTE — Telephone Encounter (Signed)
Pt called in returning a call about results 

## 2022-07-26 NOTE — Telephone Encounter (Signed)
See other phone note

## 2022-08-05 IMAGING — DX DG FINGER THUMB 2+V*L*
3 series · 3 of 3 positions shown · non-contrast
Comparison: None.

CLINICAL DATA: Injury, fall yesterday.  Thumb pain.

EXAM:
LEFT THUMB 2+V

[finger ap]
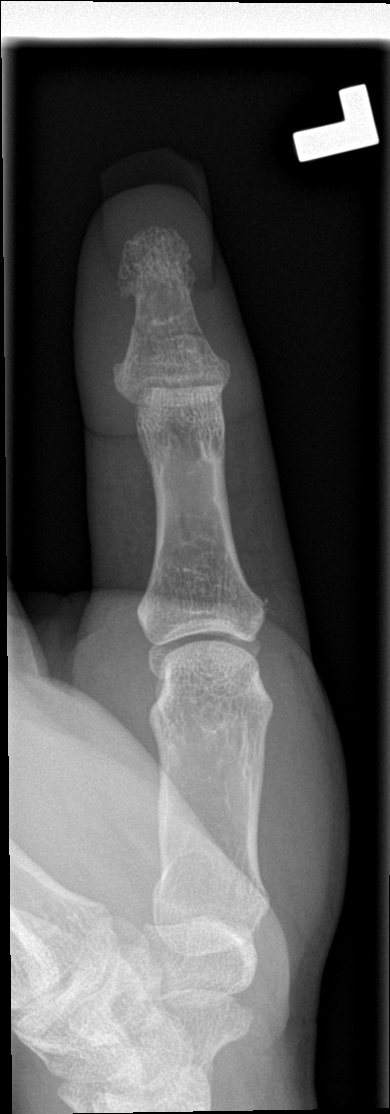

[finger obl]
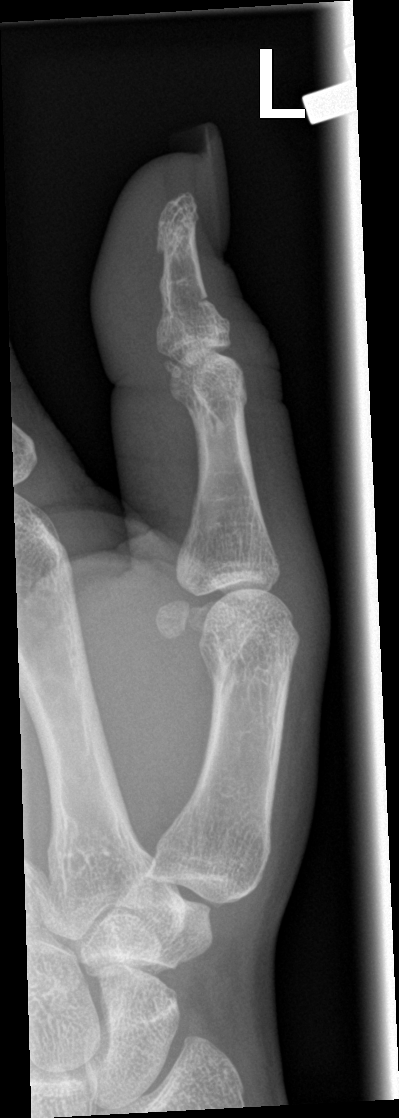

[finger lat]
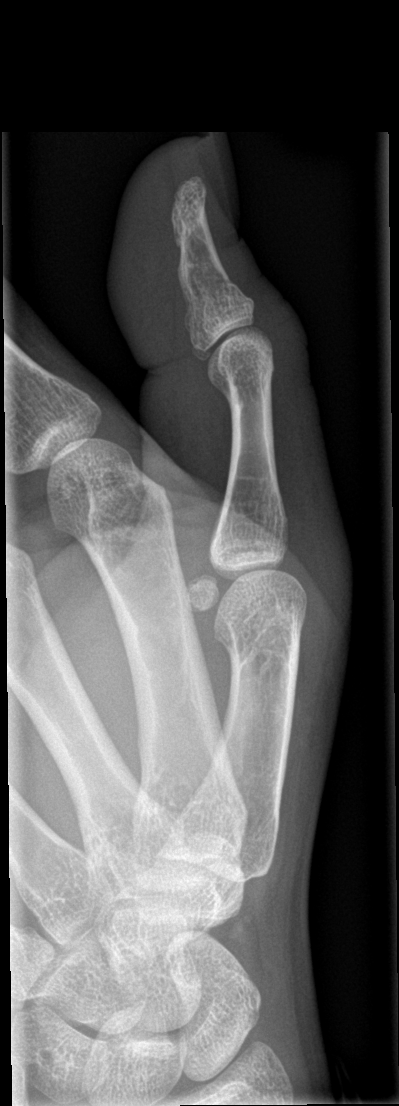

[3 of 3 positions shown; findings below may reference images not displayed]

FINDINGS: Transverse nondisplaced fracture of the thumb distal phalanx. There
is no intra-articular extension. Normal alignment and joint spaces.
IMPRESSION: Nondisplaced thumb distal phalanx fracture. No intra-articular
extension.

## 2022-10-21 ENCOUNTER — Ambulatory Visit: Payer: Commercial Managed Care - HMO | Admitting: Neurology

## 2023-03-21 ENCOUNTER — Ambulatory Visit: Payer: Commercial Managed Care - HMO | Admitting: Neurology

## 2023-10-25 ENCOUNTER — Other Ambulatory Visit: Payer: Self-pay | Admitting: Obstetrics and Gynecology

## 2023-10-25 DIAGNOSIS — Z1231 Encounter for screening mammogram for malignant neoplasm of breast: Secondary | ICD-10-CM

## 2023-11-08 ENCOUNTER — Ambulatory Visit

## 2023-11-10 ENCOUNTER — Ambulatory Visit
Admission: RE | Admit: 2023-11-10 | Discharge: 2023-11-10 | Disposition: A | Discharge status: Home / Self Care | Source: Ambulatory Visit | Attending: Obstetrics and Gynecology | Admitting: Obstetrics and Gynecology

## 2023-11-10 DIAGNOSIS — Z1231 Encounter for screening mammogram for malignant neoplasm of breast: Secondary | ICD-10-CM

## 2023-11-16 ENCOUNTER — Other Ambulatory Visit: Payer: Self-pay | Admitting: Obstetrics and Gynecology

## 2023-11-16 DIAGNOSIS — R928 Other abnormal and inconclusive findings on diagnostic imaging of breast: Secondary | ICD-10-CM

## 2023-12-05 ENCOUNTER — Ambulatory Visit
Admission: RE | Admit: 2023-12-05 | Discharge: 2023-12-05 | Disposition: A | Discharge status: Home / Self Care | Source: Ambulatory Visit | Attending: Obstetrics and Gynecology | Admitting: Obstetrics and Gynecology

## 2023-12-05 DIAGNOSIS — R928 Other abnormal and inconclusive findings on diagnostic imaging of breast: Secondary | ICD-10-CM
